# Patient Record
Sex: Female | Born: 2000
Health system: Southern US, Community
[De-identification: ages and names within clinical notes are randomized; demographics above are authoritative.]

## PROBLEM LIST (undated history)

## (undated) DIAGNOSIS — F329 Major depressive disorder, single episode, unspecified: Secondary | ICD-10-CM

## (undated) DIAGNOSIS — D649 Anemia, unspecified: Secondary | ICD-10-CM

## (undated) DIAGNOSIS — F419 Anxiety disorder, unspecified: Secondary | ICD-10-CM

## (undated) DIAGNOSIS — R45851 Suicidal ideations: Secondary | ICD-10-CM

## (undated) DIAGNOSIS — J45909 Unspecified asthma, uncomplicated: Secondary | ICD-10-CM

## (undated) DIAGNOSIS — F32A Depression, unspecified: Secondary | ICD-10-CM

## (undated) HISTORY — DX: Depression, unspecified: F32.A

## (undated) HISTORY — DX: Suicidal ideations: R45.851

---
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
## 2019-07-25 ENCOUNTER — Inpatient Hospital Stay (HOSPITAL_COMMUNITY)
Admission: AD | Admit: 2019-07-25 | Discharge: 2019-07-27 | DRG: 885 | Disposition: A | Discharge status: Home / Self Care | Payer: 59 | Source: Intra-hospital | Attending: Psychiatry | Admitting: Psychiatry

## 2019-07-25 ENCOUNTER — Encounter (HOSPITAL_COMMUNITY): Payer: Self-pay | Admitting: Psychiatry

## 2019-07-25 ENCOUNTER — Ambulatory Visit (HOSPITAL_COMMUNITY)
Admission: RE | Admit: 2019-07-25 | Discharge: 2019-07-25 | Disposition: A | Discharge status: Home / Self Care | Payer: 59 | Attending: Psychiatry | Admitting: Psychiatry

## 2019-07-25 DIAGNOSIS — R45851 Suicidal ideations: Secondary | ICD-10-CM | POA: Diagnosis present

## 2019-07-25 DIAGNOSIS — F29 Unspecified psychosis not due to a substance or known physiological condition: Secondary | ICD-10-CM | POA: Diagnosis present

## 2019-07-25 DIAGNOSIS — Z818 Family history of other mental and behavioral disorders: Secondary | ICD-10-CM | POA: Diagnosis not present

## 2019-07-25 DIAGNOSIS — G4700 Insomnia, unspecified: Secondary | ICD-10-CM | POA: Diagnosis present

## 2019-07-25 DIAGNOSIS — R45850 Homicidal ideations: Secondary | ICD-10-CM | POA: Diagnosis not present

## 2019-07-25 DIAGNOSIS — Z915 Personal history of self-harm: Secondary | ICD-10-CM | POA: Diagnosis not present

## 2019-07-25 DIAGNOSIS — Z20828 Contact with and (suspected) exposure to other viral communicable diseases: Secondary | ICD-10-CM | POA: Insufficient documentation

## 2019-07-25 DIAGNOSIS — F481 Depersonalization-derealization syndrome: Secondary | ICD-10-CM | POA: Diagnosis present

## 2019-07-25 DIAGNOSIS — J45909 Unspecified asthma, uncomplicated: Secondary | ICD-10-CM | POA: Diagnosis present

## 2019-07-25 DIAGNOSIS — Z62810 Personal history of physical and sexual abuse in childhood: Secondary | ICD-10-CM | POA: Insufficient documentation

## 2019-07-25 DIAGNOSIS — F323 Major depressive disorder, single episode, severe with psychotic features: Secondary | ICD-10-CM

## 2019-07-25 DIAGNOSIS — Z23 Encounter for immunization: Secondary | ICD-10-CM | POA: Diagnosis not present

## 2019-07-25 DIAGNOSIS — Z9119 Patient's noncompliance with other medical treatment and regimen: Secondary | ICD-10-CM

## 2019-07-25 DIAGNOSIS — F333 Major depressive disorder, recurrent, severe with psychotic symptoms: Secondary | ICD-10-CM | POA: Insufficient documentation

## 2019-07-25 HISTORY — DX: Unspecified psychosis not due to a substance or known physiological condition: F29

## 2019-07-25 LAB — SARS CORONAVIRUS 2 BY RT PCR (HOSPITAL ORDER, PERFORMED IN ~~LOC~~ HOSPITAL LAB): SARS Coronavirus 2: NEGATIVE

## 2019-07-25 MED ORDER — MAGNESIUM HYDROXIDE 400 MG/5ML PO SUSP: 30.0000 mL | Freq: Every day | ORAL | Status: DC | PRN

## 2019-07-25 MED ORDER — ACETAMINOPHEN 325 MG PO TABS: 650.0000 mg | ORAL_TABLET | Freq: Four times a day (QID) | ORAL | Status: DC | PRN

## 2019-07-25 MED ORDER — ALBUTEROL SULFATE HFA 108 (90 BASE) MCG/ACT IN AERS: 1.0000 | INHALATION_SPRAY | RESPIRATORY_TRACT | Status: DC | PRN

## 2019-07-25 MED ORDER — ALUM & MAG HYDROXIDE-SIMETH 200-200-20 MG/5ML PO SUSP: 30.0000 mL | ORAL | Status: DC | PRN

## 2019-07-25 MED ORDER — TRAZODONE HCL 50 MG PO TABS
50.0000 mg | ORAL_TABLET | Freq: Every evening | ORAL | Status: DC | PRN
  Administered 2019-07-25: 50 mg via ORAL
  Filled 2019-07-25: qty 1

## 2019-07-25 MED ORDER — HYDROXYZINE HCL 25 MG PO TABS
25.0000 mg | ORAL_TABLET | Freq: Three times a day (TID) | ORAL | Status: DC | PRN
  Administered 2019-07-25: 25 mg via ORAL
  Filled 2019-07-25: qty 1

## 2019-07-25 NOTE — H&P (Signed)
Behavioral Health Medical Screening Exam  Elizabeth Waters is an 18 y.o. female with history of depression with prior inpatient admissions to child/adolescent unit. She is presenting with her mother for new onset of paranoia, behavioral changes, and derealization over the last month. She reports feeling that she is being followed and monitored, particularly in the bathroom and hallways. She reports VH of lights and "black balls." She states "I don't know if I'm alive or not." She reports intermittent SI. She has history of four prior suicide attempts. She reports HI toward a former friend with no plan and is vague in describing this. Per her mother, she has shown behavioral changes with increased isolation to her bedroom and erratic sleep patterns over the last month- "I can just tell something is off." Denies drug/alcohol use.  Total Time spent with patient: 15 minutes  Psychiatric Specialty Exam: Physical Exam  Vitals reviewed. Constitutional: She is oriented to person, place, and time. She appears well-developed and well-nourished.  Cardiovascular: Normal rate.  Respiratory: Effort normal.  Neurological: She is alert and oriented to person, place, and time.    Review of Systems  Constitutional: Negative.   Respiratory: Negative for cough and shortness of breath.   Cardiovascular: Negative for chest pain.  Gastrointestinal: Negative for diarrhea, nausea and vomiting.  Neurological: Negative for tremors, sensory change and headaches.  Psychiatric/Behavioral: Positive for depression, hallucinations and suicidal ideas. Negative for substance abuse. The patient is nervous/anxious and has insomnia.     Blood pressure 125/84, pulse 100, temperature 99 F (37.2 C), temperature source Oral, resp. rate 20, SpO2 100 %.There is no height or weight on file to calculate BMI.  General Appearance: Casual  Eye Contact:  Fair  Speech:  Normal Rate  Volume:  Normal  Mood:  Anxious and Depressed  Affect:   Blunt  Thought Process:  Coherent  Orientation:  Full (Time, Place, and Person)  Thought Content:  Hallucinations: Visual and Paranoid Ideation  Suicidal Thoughts:  Yes.  without intent/plan  Homicidal Thoughts:  Yes.  without intent/plan  Memory:  Immediate;   Fair Recent;   Fair Remote;   Fair  Judgement:  Fair  Insight:  Lacking  Psychomotor Activity:  Normal  Concentration: Concentration: Fair and Attention Span: Fair  Recall:  [employer] of Knowledge:Fair  Language: Good  Akathisia:  No  Handed:  Right  AIMS (if indicated):     Assets:  Communication Skills Housing Physical Health Resilience Social Support  Sleep:       Musculoskeletal: Strength & Muscle Tone: within normal limits Gait & Station: normal Patient leans: N/A  Blood pressure 125/84, pulse 100, temperature 99 F (37.2 C), temperature source Oral, resp. rate 20, SpO2 100 %.  Recommendations:  Inpatient hospitalization. Based on my evaluation the patient does not appear to have an emergency medical condition.  Connye Burkitt, NP 07/25/2019, 3:59 PM
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
## 2019-07-26 ENCOUNTER — Other Ambulatory Visit: Payer: Self-pay

## 2019-07-26 DIAGNOSIS — F333 Major depressive disorder, recurrent, severe with psychotic symptoms: Principal | ICD-10-CM

## 2019-07-26 LAB — URINALYSIS, ROUTINE W REFLEX MICROSCOPIC
Bilirubin Urine: NEGATIVE
Glucose, UA: NEGATIVE mg/dL
Hgb urine dipstick: NEGATIVE
Ketones, ur: NEGATIVE mg/dL
Leukocytes,Ua: NEGATIVE
Nitrite: NEGATIVE
Protein, ur: NEGATIVE mg/dL
Specific Gravity, Urine: 1.026 (ref 1.005–1.030)
pH: 5 (ref 5.0–8.0)

## 2019-07-26 LAB — COMPREHENSIVE METABOLIC PANEL
ALT: 13 U/L (ref 0–44)
AST: 16 U/L (ref 15–41)
Albumin: 3.9 g/dL (ref 3.5–5.0)
Alkaline Phosphatase: 87 U/L (ref 38–126)
Anion gap: 11 (ref 5–15)
BUN: 12 mg/dL (ref 6–20)
CO2: 21 mmol/L — ABNORMAL LOW (ref 22–32)
Calcium: 9.1 mg/dL (ref 8.9–10.3)
Chloride: 108 mmol/L (ref 98–111)
Creatinine, Ser: 0.71 mg/dL (ref 0.44–1.00)
GFR calc Af Amer: >60 mL/min (ref >60)
GFR calc non Af Amer: >60 mL/min (ref >60)
Glucose, Bld: 88 mg/dL (ref 70–99)
Potassium: 3.5 mmol/L (ref 3.5–5.1)
Sodium: 140 mmol/L (ref 135–145)
Total Bilirubin: 0.5 mg/dL (ref 0.3–1.2)
Total Protein: 7.5 g/dL (ref 6.5–8.1)

## 2019-07-26 LAB — LIPID PANEL
Cholesterol: 175 mg/dL (ref 0–200)
HDL: 48 mg/dL (ref >40)
LDL Cholesterol: 115 mg/dL — ABNORMAL HIGH (ref 0–99)
Total CHOL/HDL Ratio: 3.6 RATIO
Triglycerides: 62 mg/dL (ref <150)
VLDL: 12 mg/dL (ref 0–40)

## 2019-07-26 LAB — RAPID URINE DRUG SCREEN, HOSP PERFORMED
Amphetamines: NOT DETECTED
Barbiturates: NOT DETECTED
Benzodiazepines: NOT DETECTED
Cocaine: NOT DETECTED
Opiates: NOT DETECTED
Tetrahydrocannabinol: NOT DETECTED

## 2019-07-26 LAB — CBC
HCT: 37.6 % (ref 36.0–46.0)
Hemoglobin: 10.9 g/dL — ABNORMAL LOW (ref 12.0–15.0)
MCH: 23.7 pg — ABNORMAL LOW (ref 26.0–34.0)
MCHC: 29 g/dL — ABNORMAL LOW (ref 30.0–36.0)
MCV: 81.7 fL (ref 80.0–100.0)
Platelets: 487 10*3/uL — ABNORMAL HIGH (ref 150–400)
RBC: 4.6 MIL/uL (ref 3.87–5.11)
RDW: 17.6 % — ABNORMAL HIGH (ref 11.5–15.5)
WBC: 9 10*3/uL (ref 4.0–10.5)
nRBC: 0 % (ref 0.0–0.2)

## 2019-07-26 LAB — HEMOGLOBIN A1C
Hgb A1c MFr Bld: 5.4 % (ref 4.8–5.6)
Mean Plasma Glucose: 108.28 mg/dL

## 2019-07-26 LAB — TSH: TSH: 2.208 u[IU]/mL (ref 0.350–4.500)

## 2019-07-26 LAB — PREGNANCY, URINE: Preg Test, Ur: NEGATIVE

## 2019-07-26 MED ORDER — PRENATAL MULTIVITAMIN CH
1.0000 | ORAL_TABLET | Freq: Every day | ORAL | Status: DC
  Administered 2019-07-26 – 2019-07-27 (×2): 1 via ORAL
  Filled 2019-07-26 (×3): qty 1

## 2019-07-26 MED ORDER — INFLUENZA VAC SPLIT QUAD 0.5 ML IM SUSY
0.5000 mL | PREFILLED_SYRINGE | INTRAMUSCULAR | Status: AC
  Administered 2019-07-27: 0.5 mL via INTRAMUSCULAR
  Filled 2019-07-26: qty 0.5

## 2019-07-26 MED ORDER — ZOLPIDEM TARTRATE 5 MG PO TABS
10.0000 mg | ORAL_TABLET | Freq: Every day | ORAL | Status: DC
  Administered 2019-07-26: 10 mg via ORAL
  Filled 2019-07-26: qty 2

## 2019-07-26 MED ORDER — MODAFINIL 100 MG PO TABS
100.0000 mg | ORAL_TABLET | Freq: Every day | ORAL | Status: DC
  Administered 2019-07-26 – 2019-07-27 (×2): 100 mg via ORAL
  Filled 2019-07-26 (×2): qty 1

## 2019-07-26 MED ORDER — VORTIOXETINE HBR 10 MG PO TABS
10.0000 mg | ORAL_TABLET | Freq: Every day | ORAL | Status: DC
  Administered 2019-07-26 – 2019-07-27 (×2): 10 mg via ORAL
  Filled 2019-07-26 (×3): qty 1

## 2019-07-26 NOTE — [facility] Suicide Risk Assessment (Signed)
[facility] Admission Suicide Risk Assessment   Nursing information obtained from:  Patient Demographic factors:  Unemployed, Adolescent or young adult Current Mental Status:  NA Loss Factors:  NA Historical Factors:  Prior suicide attempts Risk Reduction Factors:  Positive therapeutic relationship, Living with another person, especially a relative, Positive coping skills or problem solving skills  Total Time spent with patient: 45 minutes Principal Problem: <principal problem not specified> Diagnosis:  Active Problems:   Psychosis ([place])  Subjective Data: Admitted for stabilization  Continued Clinical Symptoms:  Alcohol Use Disorder Identification Test Final Score (AUDIT): 0 The "Alcohol Use Disorders Identification Test", Guidelines for Use in Primary Care, Second Edition.  World Pharmacologist [facility]). Score between 0-7:  no or low risk or alcohol related problems. Score between 8-15:  moderate risk of alcohol related problems. Score between 16-19:  high risk of alcohol related problems. Score 20 or above:  warrants further diagnostic evaluation for alcohol dependence and treatment.   CLINICAL FACTORS:   Depression:   Insomnia  Musculoskeletal: Strength & Muscle Tone: within normal limits Gait & Station: normal Patient leans: N/A  Psychiatric Specialty Exam: Physical Exam  Nursing note and vitals reviewed. Constitutional: She appears well-developed and well-nourished.  Cardiovascular: Normal rate and regular rhythm.    Review of Systems  Constitutional: Negative.   Eyes: Negative.   Cardiovascular: Negative.   Gastrointestinal: Negative.   Skin: Negative.   Neurological: Negative.   Endo/Heme/Allergies: Negative.     Blood pressure 140/82, pulse (!) 105, temperature 98.8 F (37.1 C), temperature source Oral, resp. rate 16, height 5\' 3"  (1.6 m), weight 100 kg, SpO2 98 %.Body mass index is 39.05 kg/m.  General Appearance: Casual  Eye Contact:  Good  Speech:  Normal  Rate  Volume:  Normal  Mood:  Dysphoric  Affect:  Flat  Thought Process:  Coherent, Goal Directed and Descriptions of Associations: Circumstantial  Orientation:  Full (Time, Place, and Person)  Thought Content:  Tangential  Suicidal Thoughts:  No  Homicidal Thoughts:  No  Memory:  Immediate;   Fair Recent;   Fair Remote;   Fair  Judgement:  Fair  Insight:  Fair  Psychomotor Activity:  Normal  Concentration:  Concentration: Fair and Attention Span: Fair  Recall:  [employer] of Knowledge:  Fair  Language:  Fair  Akathisia:  Negative  Handed:  Right  AIMS (if indicated):     Assets:  Communication Skills Desire for Improvement  ADL's:  Intact  Cognition:  WNL  Sleep:         COGNITIVE FEATURES THAT CONTRIBUTE TO RISK:  Polarized thinking    SUICIDE RISK:   Mild:  Suicidal ideation of limited frequency, intensity, duration, and specificity.  There are no identifiable plans, no associated intent, mild dysphoria and related symptoms, good self-control (both objective and subjective assessment), few other risk factors, and identifiable protective factors, including available and accessible social support.  PLAN OF CARE: Admit for stabilization  I certify that inpatient services furnished can reasonably be expected to improve the patient's condition.   Johnn Hai, MD 07/26/2019, 9:21 AM

## 2019-07-26 NOTE — [facility] Suicide Risk Assessment (Signed)
[place] INPATIENT:  Family/Significant Other Suicide Prevention Education  Suicide Prevention Education:  Patient Refusal for Family/Significant Other Suicide Prevention Education: The patient Elizabeth Waters has refused to provide written consent for family/significant other to be provided Family/Significant Other Suicide Prevention Education during admission and/or prior to discharge.  Physician notified.  SPE completed with pt, as pt refused to consent to family contact. SPI pamphlet provided to pt and pt was encouraged to share information with support network, ask questions, and talk about any concerns relating to SPE. Pt denies access to guns/firearms and verbalized understanding of information provided. Mobile Crisis information also provided to pt.   Rozann Lesches 07/26/2019, 11:29 AM

## 2019-07-26 NOTE — Progress Notes (Signed)
Recreation Therapy Notes  INPATIENT RECREATION THERAPY ASSESSMENT  Patient Details Name: Elizabeth Waters MRN: [id number] DOB: September 16, 2000 Today's Date: 07/26/2019       Information Obtained From: Patient  Able to Participate in Assessment/Interview: Yes  Patient Presentation: Alert  Reason for Admission (Per Patient): Other (Comments)(Depression; Confused)  Patient Stressors: Family, Other (Comment)(Living situation)  Coping Skills:   Isolation, TV, Music, Talk, Art, Avoidance, Hot Bath/Shower  Leisure Interests (2+):  Individual - Other (Comment), Music - Singing, Art - Draw, Crafts - Other (Comment)(Sleep; Make jewelry)  Frequency of Recreation/Participation: Other (Comment)(Daily)  Awareness of Community Resources:  Yes  Community Resources:  Restaurants  Current Use: Yes  If no, Barriers?:    Expressed Interest in [employer] Information: No  County of Residence:  [place]  Patient Main Form of Transportation: Car  Patient Strengths:  Caring; Willing to hear both sides of an argument  Patient Identified Areas of Improvement:  Memory; Concentration  Patient Goal for Hospitalization:  "try to figure out what's going on and concentration"  Current SI (including self-harm):  No  Current HI:  No  Current AVH: No  Staff Intervention Plan: Group Attendance, Collaborate with Interdisciplinary Treatment Team  Consent to Intern Participation: N/A    Victorino Sparrow, LRT/CTRS  Victorino Sparrow A 07/26/2019, 11:48 AM

## 2019-07-26 NOTE — Tx Team (Signed)
Initial Treatment Plan 07/26/2019 3:21 AM Elizabeth Waters Elizabeth Waters HAL:[id number]    PATIENT STRESSORS: Other: Disturbed thought process   PATIENT STRENGTHS: Ability for insight Communication skills General fund of knowledge Physical Health   PATIENT IDENTIFIED PROBLEMS: Depression  Anxiety  hallucinations  paranoia               DISCHARGE CRITERIA:  Improved stabilization in mood, thinking, and/or behavior Medical problems require only outpatient monitoring Motivation to continue treatment in a less acute level of care Verbal commitment to aftercare and medication compliance  PRELIMINARY DISCHARGE PLAN: Outpatient therapy Participate in family therapy Return to previous living arrangement  PATIENT/FAMILY INVOLVEMENT: This treatment plan has been presented to and reviewed with the patient, Elizabeth Waters.  The patient has been given the opportunity to ask questions and make suggestions.  Ronelle Nigh, RN 07/26/2019, 3:21 AM

## 2019-07-26 NOTE — Progress Notes (Signed)
Recreation Therapy Notes  Date: 12.1.20 Time: 1000 Location: 500 Hall Dayroom  Group Topic: Coping Skills  Goal Area(s) Addresses:  Patient will identify positive coping skills. Patient will identify benefit of using coping skills post d/c.  Behavioral Response: Engaged  Intervention: Worksheet, pencils  Activity: Mind Map.  LRT and patients filled in the first 8 boxes of the mind map with being honest, anxiety, loss of job, having kids, school, work, depression and losing someone.  Patients were then given time to come up with at least 3 coping skills for each situation identified.   Education: Radiographer, therapeutic, Dentist.   Education Outcome: Acknowledges understanding/In group clarification offered/Needs additional education.   Clinical Observations/Feedback: Pt identified coping skills as coloring, music, walking and singing.  Pt was attentive and engaged throughout activity.     Victorino Sparrow, LRT/CTRS     Victorino Sparrow A 07/26/2019 11:24 AM

## 2019-07-26 NOTE — Progress Notes (Signed)
   07/26/19 2300  Psych Admission Type (Psych Patients Only)  Admission Status Voluntary  Psychosocial Assessment  Patient Complaints Depression  Eye Contact Fair  Facial Expression Flat;Pensive  Affect Depressed;Sad  Speech Logical/coherent  Interaction Cautious;Submissive  Motor Activity Other (Comment) (WNL)  Appearance/Hygiene Unremarkable  Behavior Characteristics Cooperative  Mood Depressed;Sad  Thought Process  Coherency WDL  Content WDL  Delusions WDL  Perception WDL  Hallucination Visual  Judgment Poor  Confusion WDL  Danger to Self  Current suicidal ideation? Denies  Danger to Others  Danger to Others None reported or observed   Pt presented sad, but brightened on approach and was very pleasant when talking. Pt stated she felt a little better

## 2019-07-26 NOTE — Progress Notes (Signed)
Patient denies SI, HI and AVH this shift.  Patient has been compliant with medications and attended groups this shift.  Patient has had no incidents of behavioral dyscontrol.   Assess patient for safety, offer medications as prescribed, engage patient in 1:1 staff talks.   Patient able to contract for safety.  Continue to monitor as prescribed.  
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
## 2019-07-26 NOTE — H&P (Signed)
Psychiatric Admission Assessment Adult  Patient Identification: Elizabeth Waters MRN:  [id number] Date of Evaluation:  07/26/2019 Chief Complaint: Depression with psychosis and complaints of derealization  principal Diagnosis:  Diagnosis:  Active Problems:   Psychosis (HCC)  History of Present Illness:   This is the third lifetime psychiatric admission but the first as an adult for Elizabeth Waters, she is 18 years of age and presented voluntarily on 11/30 with the chief complaint of "Derealization" She also reports chronic depression, her last admission to the adolescent unit was at age 21, the year 2018, when she complained of suicidal thoughts and plans.  She in the past had been prescribed fluoxetine but has not had it for the past 2 months-  She specifically describes "derealization as" watching videos or some other activity and then losing track of what she is doing and becoming a little disoriented but she is never unaware of person place day so forth just situation-further she reports seeing things "in the corner sometimes" but this is a rather vague complaint.  She also reported intermittent suicidal thoughts with 4 prior attempts to the nurse practitioner yesterday but denies current suicidal thoughts plans or intent. Denies substance abuse  Associated Signs/Symptoms: Depression Symptoms:  insomnia, reports days and nights are reversed (Hypo) Manic Symptoms:  Distractibility, Anxiety Symptoms:  Excessive Worry, Psychotic Symptoms:  Hallucinations: Visual PTSD Symptoms: NA Total Time spent with patient: 45 minutes  Past Psychiatric History: As discussed prior admissions as a teen  Is the patient at risk to self? Yes.    Has the patient been a risk to self in the past 6 months? No.  Has the patient been a risk to self within the distant past? Yes.    Is the patient a risk to others? No.  Has the patient been a risk to others in the past 6 months? No.  Has the patient been a risk to others  within the distant past? No.   Prior Inpatient Therapy:  2 prior admissions as discussed most recently on fluoxetine Prior Outpatient Therapy:  Noncompliant with fluoxetine about 2 months  Alcohol Screening: 1. How often do you have a drink containing alcohol?: Never 2. How many drinks containing alcohol do you have on a typical day when you are drinking?: 1 or 2 3. How often do you have six or more drinks on one occasion?: Never AUDIT-C Score: 0 4. How often during the last year have you found that you were not able to stop drinking once you had started?: Never 5. How often during the last year have you failed to do what was normally expected from you becasue of drinking?: Never 6. How often during the last year have you needed a first drink in the morning to get yourself going after a heavy drinking session?: Never 7. How often during the last year have you had a feeling of guilt of remorse after drinking?: Never 8. How often during the last year have you been unable to remember what happened the night before because you had been drinking?: Never 9. Have you or someone else been injured as a result of your drinking?: No 10. Has a relative or friend or a doctor or another health worker been concerned about your drinking or suggested you cut down?: No Alcohol Use Disorder Identification Test Final Score (AUDIT): 0 Alcohol Brief Interventions/Follow-up: Continued Monitoring Substance Abuse History in the last 12 months:  No. Consequences of Substance Abuse: NA Previous Psychotropic Medications: Yes  Psychological Evaluations:  No  Past Medical History:  Past Medical History:  Diagnosis Date  . Anemia   . Anxiety   . Asthma   . Depression    History reviewed. No pertinent surgical history. Family History:  Family History  Problem Relation Age of Onset  . Hypertension Mother   . Diabetes Father   . Asthma Father   . Hypertension Father   . Depression Father    Family Psychiatric   History: Patient denies Tobacco Screening: Have you used any form of tobacco in the last 30 days? (Cigarettes, Smokeless Tobacco, Cigars, and/or Pipes): No Social History:  Social History   Substance and Sexual Activity  Alcohol Use No     Social History   Substance and Sexual Activity  Drug Use No    Additional Social History:                           Allergies:   Allergies  Allergen Reactions  . Almond Oil Shortness Of Breath  . Grapeseed Extract [Nutritional Supplements] Shortness Of Breath    Grapes  . Other Shortness Of Breath    Pineapple, almonds 06/24/17: Patient states she is also allergic to [facility] fruit  . Pineapple Shortness Of Breath   Lab Results:  Results for orders placed or performed during the hospital encounter of 07/25/19 (from the past 48 hour(s))  Pregnancy, urine     Status: None   Collection Time: 07/26/19  6:27 AM  Result Value Ref Range   Preg Test, Ur NEGATIVE NEGATIVE    Comment:        THE SENSITIVITY OF THIS METHODOLOGY IS >20 mIU/mL. Performed at [facility], [address] [place], [place] [address]   Urinalysis, Routine w reflex microscopic     Status: None   Collection Time: 07/26/19  6:27 AM  Result Value Ref Range   Color, Urine YELLOW YELLOW   APPearance CLEAR CLEAR   Specific Gravity, Urine 1.026 1.005 - 1.030   pH 5.0 5.0 - 8.0   Glucose, UA NEGATIVE NEGATIVE mg/dL   Hgb urine dipstick NEGATIVE NEGATIVE   Bilirubin Urine NEGATIVE NEGATIVE   Ketones, ur NEGATIVE NEGATIVE mg/dL   Protein, ur NEGATIVE NEGATIVE mg/dL   Nitrite NEGATIVE NEGATIVE   Leukocytes,Ua NEGATIVE NEGATIVE    Comment: Performed at [facility], [address] [place], [place] [address]  CBC     Status: Abnormal   Collection Time: 07/26/19  6:35 AM  Result Value Ref Range   WBC 9.0 4.0 - 10.5 K/uL   RBC 4.60 3.87 - 5.11 MIL/uL   Hemoglobin 10.9 (L) 12.0 - 15.0 g/dL   HCT 13.037.6 86.536.0 - 78.446.0 %   MCV 81.7  80.0 - 100.0 fL   MCH 23.7 (L) 26.0 - 34.0 pg   MCHC 29.0 (L) 30.0 - 36.0 g/dL   RDW 69.617.6 (H) 29.511.5 - 28.415.5 %   Platelets 487 (H) 150 - 400 K/uL   nRBC 0.0 0.0 - 0.2 %    Comment: Performed at [facility], [address] [place], [place] [address]    Blood Alcohol level:  Lab Results  Component Value Date   [facility] <10 11/03/2017   ETH <10 06/24/2017    Metabolic Disorder Labs:  Lab Results  Component Value Date   HGBA1C 5.3 06/26/2017   MPG 105.41 06/26/2017   MPG 103 12/07/2016   Lab Results  Component Value Date  PROLACTIN 43.0 (H) 06/26/2017   Lab Results  Component Value Date   CHOL 156 06/26/2017   TRIG 57 06/26/2017   HDL 49 06/26/2017   CHOLHDL 3.2 06/26/2017   VLDL 11 06/26/2017   LDLCALC 96 06/26/2017   LDLCALC 85 12/07/2016    Current Medications: Current Facility-Administered Medications  Medication Dose Route Frequency Provider Last Rate Last Dose  . acetaminophen (TYLENOL) tablet 650 mg  650 mg Oral Q6H PRN Aldean Baker, NP      . albuterol (VENTOLIN HFA) 108 (90 Base) MCG/ACT inhaler 1-2 puff  1-2 puff Inhalation Q4H PRN Aldean Baker, NP      . alum & mag hydroxide-simeth (MAALOX/MYLANTA) 200-200-20 MG/5ML suspension 30 mL  30 mL Oral Q4H PRN Aldean Baker, NP      . hydrOXYzine (ATARAX/VISTARIL) tablet 25 mg  25 mg Oral TID PRN Aldean Baker, NP   25 mg at 07/25/19 2355  . magnesium hydroxide (MILK OF MAGNESIA) suspension 30 mL  30 mL Oral Daily PRN Aldean Baker, NP      . modafinil (PROVIGIL) tablet 100 mg  100 mg Oral Daily Malvin Johns, MD      . prenatal multivitamin tablet 1 tablet  1 tablet Oral Q1200 Malvin Johns, MD      . traZODone (DESYREL) tablet 50 mg  50 mg Oral QHS PRN Aldean Baker, NP   50 mg at 07/25/19 2354  . vortioxetine HBr (TRINTELLIX) tablet 10 mg  10 mg Oral Daily Malvin Johns, MD      . zolpidem Remus Loffler) tablet 10 mg  10 mg Oral QHS Malvin Johns, MD       PTA Medications: Medications Prior to  Admission  Medication Sig Dispense Refill Last Dose  . albuterol (PROVENTIL HFA;VENTOLIN HFA) 108 (90 Base) MCG/ACT inhaler Inhale 2 puffs into the lungs every 4 (four) hours as needed for wheezing or shortness of breath. 2 Inhaler 0   . FLUoxetine (PROZAC) 40 MG capsule TAKE 1 CAPSULE BY MOUTH DAILY. 30 capsule 2   . hydrOXYzine (ATARAX/VISTARIL) 25 MG tablet Take 1 tablet (25 mg total) at bedtime as needed by mouth (insomnia). (Patient not taking: Reported on 11/03/2017) 30 tablet 0     Musculoskeletal: Strength & Muscle Tone: within normal limits Gait & Station: normal Patient leans: N/A  Psychiatric Specialty Exam: Physical Exam  Nursing note and vitals reviewed. Constitutional: She appears well-developed and well-nourished.  Cardiovascular: Normal rate and regular rhythm.    Review of Systems  Constitutional: Negative.   Eyes: Negative.   Cardiovascular: Negative.   Gastrointestinal: Negative.   Skin: Negative.   Neurological: Negative.   Endo/Heme/Allergies: Negative.     Blood pressure 140/82, pulse (!) 105, temperature 98.8 F (37.1 C), temperature source Oral, resp. rate 16, height 5\' 3"  (1.6 m), weight 100 kg, SpO2 98 %.Body mass index is 39.05 kg/m.  General Appearance: Casual  Eye Contact:  Good  Speech:  Normal Rate  Volume:  Normal  Mood:  Dysphoric  Affect:  Flat  Thought Process:  Coherent, Goal Directed and Descriptions of Associations: Circumstantial  Orientation:  Full (Time, Place, and Person)  Thought Content:  Tangential  Suicidal Thoughts:  No  Homicidal Thoughts:  No  Memory:  Immediate;   Fair Recent;   Fair Remote;   Fair  Judgement:  Fair  Insight:  Fair  Psychomotor Activity:  Normal  Concentration:  Concentration: Fair and Attention Span: Fair  Recall:   of Knowledge:  Fair  Language:  Fair  Akathisia:  Negative  Handed:  Right  AIMS (if indicated):     Assets:  Communication Skills Desire for Improvement  ADL's:  Intact   Cognition:  WNL  Sleep:       Treatment Plan Summary: Daily contact with patient to assess and evaluate symptoms and progress in treatment and Medication management  Observation Level/Precautions:  15 minute checks  Laboratory:  UDS  Psychotherapy: Reality based  Medications: Begin fluoxetine and augmentation strategies  Consultations: None necessary  Discharge Concerns: Longer-term stability  Estimated LOS: 7-10  Other:     Physician Treatment Plan for Primary Diagnosis:  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/Axis II consider Axis II pathology recommendations again begin antidepressant and augmentation strategies  Long Term Goal(s): Improvement in symptoms so as ready for discharge  Short Term Goals: Ability to identify changes in lifestyle to reduce recurrence of condition will improve, Ability to verbalize feelings will improve, Ability to disclose and discuss suicidal ideas, Ability to demonstrate self-control will improve and Ability to identify and develop effective coping behaviors will improve  Physician Treatment Plan for Secondary Diagnosis: Active Problems:   Psychosis ([place])  Long Term Goal(s): Improvement in symptoms so as ready for discharge  Short Term Goals: Ability to demonstrate self-control will improve, Ability to identify and develop effective coping behaviors will improve, Ability to maintain clinical measurements within normal limits will improve and Compliance with prescribed medications will improve  I certify that inpatient services furnished can reasonably be expected to improve the patient's condition.    Johnn Hai, MD 12/1/20208:10 AM

## 2019-07-26 NOTE — Plan of Care (Signed)
Newly admitted and currently in bed sleeping. No sign of distress.
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
## 2019-07-27 DIAGNOSIS — F323 Major depressive disorder, single episode, severe with psychotic features: Secondary | ICD-10-CM

## 2019-07-27 HISTORY — DX: Major depressive disorder, single episode, severe with psychotic features: F32.3

## 2019-07-27 LAB — PROLACTIN: Prolactin: 78.5 ng/mL — ABNORMAL HIGH (ref 4.8–23.3)

## 2019-07-27 MED ORDER — HYDROXYZINE HCL 25 MG PO TABS: 25.0000 mg | ORAL_TABLET | Freq: Three times a day (TID) | ORAL | 0 refills | Status: DC | PRN

## 2019-07-27 MED ORDER — MODAFINIL 100 MG PO TABS: 100.0000 mg | ORAL_TABLET | Freq: Every day | ORAL | 0 refills | Status: DC

## 2019-07-27 MED ORDER — PRENATAL MULTIVITAMIN CH: 1.0000 | ORAL_TABLET | Freq: Every day | ORAL | 0 refills | Status: DC

## 2019-07-27 MED ORDER — ZOLPIDEM TARTRATE 10 MG PO TABS: 10.0000 mg | ORAL_TABLET | Freq: Every day | ORAL | 0 refills | Status: DC

## 2019-07-27 MED ORDER — ALBUTEROL SULFATE HFA 108 (90 BASE) MCG/ACT IN AERS: 1.0000 | INHALATION_SPRAY | RESPIRATORY_TRACT | Status: AC | PRN

## 2019-07-27 MED ORDER — VORTIOXETINE HBR 10 MG PO TABS: 10.0000 mg | ORAL_TABLET | Freq: Every day | ORAL | 0 refills | Status: DC

## 2019-07-27 MED ORDER — TRAZODONE HCL 50 MG PO TABS: 50.0000 mg | ORAL_TABLET | Freq: Every evening | ORAL | 0 refills | Status: DC | PRN

## 2019-07-27 NOTE — Progress Notes (Signed)
Patient ID: Elizabeth Waters, female   DOB: 09/01/2000, 18 y.o.   MRN: [id number] Patient discharged to home/self care in the presence of her mother.  Patient denies SI, HI and AVH upon discharge.  Patient acknowledged understanding of all discharge instructions and receipt of all personal belongings. Patient bright and eager to discharge.

## 2019-07-27 NOTE — Progress Notes (Signed)
Recreation Therapy Notes  INPATIENT RECREATION TR PLAN  Patient Details Name: Elizabeth Waters MRN: [id number] DOB: [birth year] Today's Date: 07/27/2019  Rec Therapy Plan Is patient appropriate for Therapeutic Recreation?: Yes Treatment times per week: abou 3 days Estimated Length of Stay: 5-7 days TR Treatment/Interventions: Group participation (Comment)  Discharge Criteria Pt will be discharged from therapy if:: Discharged Treatment plan/goals/alternatives discussed and agreed upon by:: Patient/family  Discharge Summary Short term goals set: See patient care plan Short term goals met: Complete Progress toward goals comments: Groups attended Which groups?: Coping skills, Communication Reason goals not met: None Therapeutic equipment acquired: N/A Reason patient discharged from therapy: Refusal of 3 consecutive treatment sessions without medical reason Pt/family agrees with progress & goals achieved: Yes Date patient discharged from therapy: 07/27/19     Victorino Sparrow, LRT/CTRS  [name], [name] 07/27/2019, 11:18 AM

## 2019-07-27 NOTE — Progress Notes (Signed)
Virtual Visit via Telephone Note  I connected with Elizabeth Waters's Mother on 07/27/2019 at 10:05 am  by telephone and verified that I am speaking with the correct person using two identifiers.  Location:   I discussed the limitations, risks, security and privacy concerns of performing an evaluation and management service by telephone and the availability of in person appointments. I also discussed with the patient that there may be a patient responsible charge related to this service. The patient expressed understanding and agreed to proceed.   History of Present Illness: Mom was updated on Elizabeth Waters's current progress. She was told Elizabeth Waters has not had any further episodes of de-realization. Mom will come pick her up at 1 pm today. She will call us before driving here to make sure all paperwork is ready. She has no further questions.   Assessment and Plan: - Follow recommended discharge medication regimen.   Follow Up Instructions:   I discussed the assessment and treatment plan with the patient. The patient was provided an opportunity to ask questions and all were answered. The patient agreed with the plan and demonstrated an understanding of the instructions.   The patient was advised to call back or seek an in-person evaluation if the symptoms worsen or if the condition fails to improve as anticipated.  I provided 10 minutes of non-face-to-face time during this encounter.   Trinna Post, Medical Student

## 2019-07-27 NOTE — Progress Notes (Signed)
Recreation Therapy Notes  Date: 12.2.20 Time: 1000 Location: 500 Hall Dayroom  Group Topic: Communication  Goal Area(s) Addresses:  Patient will effectively communicate with peers in group.  Patient will verbalize benefit of healthy communication. Patient will verbalize positive effect of healthy communication on post d/c goals.  Patient will identify communication techniques that made activity effective for group.   Behavioral Response: Engaged  Intervention: Drawings, Pencils, Blank Paper   Activity: Geometrical Drawings.  One person comes up a describes a geometrical drawing to the rest of the group.  The presenter will get as detailed as needed to describe the picture.  The remaining members of the group can only ask the presenter to repeat themselves.  They can not ask any specific questions.  When the presenter is finished, the group will compare their pictures to the original.  Education: Communication, Discharge Planning  Education Outcome: Acknowledges understanding/In group clarification offered/Needs additional education.   Clinical Observations/Feedback:  Pt expressed she tried to be as detailed as possible when describing the pictures.  Pt was pleasant and patient with peers while giving instructions.  Pt stated communication can be affected by whether you are clear or loud enough for the other person to understand.  Pt explained body language and eye contact can affect communication as well because there are some cultures where eye contact is considered disrespectful.    Victorino Sparrow, LRT/CTRS    Victorino Sparrow A 07/27/2019 11:12 AM

## 2019-07-27 NOTE — Discharge Summary (Signed)
Physician Discharge Summary Note  Patient:  Elizabeth Waters is an 18 y.o., female  MRN:  [id number]  DOB:  [birth year]  Patient phone:  [phone] (home)   Patient address:   [address] [place] [address],   Total Time spent with patient: Greater than 30 minutes  Date of Admission:  07/25/2019  Date of Discharge: 07-27-19  Reason for Admission: Worsening depression & feeling of derealization.  Principal Problem: Severe recurrent major depressive disorder with psychotic features [facility])  Discharge Diagnoses: Principal Problem:   Severe recurrent major depressive disorder with psychotic features (HCC) Active Problems:   Major depressive disorder, single episode, severe with psychotic features (HCC)   Psychosis (HCC)  Past Psychiatric History: Severe major depressive disorder with psychotic features.  Past Medical History:  Past Medical History:  Diagnosis Date  . Anemia   . Anxiety   . Asthma   . Depression    History reviewed. No pertinent surgical history.  Family History:  Family History  Problem Relation Age of Onset  . Hypertension Mother   . Diabetes Father   . Asthma Father   . Hypertension Father   . Depression Father    Family Psychiatric  History: See H&P  Social History:  Social History   Substance and Sexual Activity  Alcohol Use No     Social History   Substance and Sexual Activity  Drug Use No    Social History   Socioeconomic History  . Marital status: Single    Spouse name: Not on file  . Number of children: Not on file  . Years of education: Not on file  . Highest education level: Not on file  Occupational History  . Not on file  Social Needs  . Financial resource strain: Not on file  . Food insecurity    Worry: Not on file    Inability: Not on file  . Transportation needs    Medical: Not on file    Non-medical: Not on file  Tobacco Use  . Smoking status: Never Smoker  . Smokeless tobacco: Never Used  Substance and  Sexual Activity  . Alcohol use: No  . Drug use: No  . Sexual activity: Yes    Birth control/protection: Condom  Lifestyle  . Physical activity    Days per week: Not on file    Minutes per session: Not on file  . Stress: Not on file  Relationships  . Social Musician on phone: Not on file    Gets together: Not on file    Attends religious service: Not on file    Active member of club or organization: Not on file    Attends meetings of clubs or organizations: Not on file    Relationship status: Not on file  Other Topics Concern  . Not on file  Social History Narrative   Pt lives with mother, older brother and younger brother. No pets in the home.    Hospital Course: (Per Md's admission assessment): This is the third lifetime psychiatric admission but the first as an adult for Elizabeth Waters, she is 18 years of age and presented voluntarily on 11/30 with the chief complaint of "Derealization". She also reports chronic depression, her last admission to the adolescent unit was at age 63, the year 2018, when she complained of suicidal thoughts and plans.  She in the past had been prescribed fluoxetine but has not had it for the past 2 months- She specifically describes "derealization as"  watching videos or some other activity and then losing track of what she is doing and becoming a little disoriented but she is never unaware of person place day so forth just situation-further she reports seeing things "in the corner sometimes" but this is a rather vague complaint.  She also reported intermittent suicidal thoughts with 4 prior attempts to the nurse practitioner yesterday but denies current suicidal thoughts plans or intent.  This is one of several psychiatric discharge summaries for this 18 year old AA female with hx of chronic depression & [facility]. She is [facility] & other psychiatric hospitals for worsening symptoms of depression, suicidal ideations with  plans. She has been tried on multiple psychotropic medications for her symptoms & it appears nothing has actually been helpful in stabilizing her symptoms. [facility] for evaluation & treatment for complaint of derealization.  After evaluation of her presenting symptoms, Elizabeth Waters was recommended for mood stabilization treatments. The medication regimen for her presenting symptoms were discussed & with her consent initiated. She received, stabilized & was discharged on the medications as listed below on her discharge medication lists. She was also enrolled & participated in the group counseling sessions being offered & held on this unit. She learned coping skills. She presented on this admission, other chronic medical conditions that required treatment & monitoring. She was resumed/discharged on all her pertinent home medications for those health issues. She tolerated her treatment regimen without any adverse effects or reactions reported.  During the course of her hospitalization, the 15-minute checks were adequate to ensure Elizabeth Waters's safety.  Patient did not display any dangerous, violent or suicidal behavior on the unit.  She interacted with patients & staff appropriately, participated appropriately in the group sessions/therapies. Her medications were addressed & adjusted to meet her needs. She was recommended for outpatient follow-up care & medication management upon discharge to assure her continuity of care.  At the time of discharge patient is not reporting any acute suicidal/homicidal ideations. She feels more confident about her self-care & in managing the suicidal thoughts. She currently denies any new issues or concerns. Education and supportive counseling provided throughout her hospital stay & upon discharge.  Today upon her discharge evaluation with the attending psychiatrist, [name] shares she is doing well. She denies any other specific concerns. She is sleeping well. Her  appetite is good. She denies other physical complaints. She denies AH/VH. She feels that her medications have been helpful & is in agreement to continue her current treatment regimen as recommended. She was able to engage in safety planning including [facility] or contact emergency services if she feels unable to maintain her own safety or the safety of others. Pt had no further questions, comments, or concerns. [facility] with all personal belongings in no apparent distress. Transportation per her family (mother).  Physical Findings: AIMS:  , ,  ,  ,    CIWA:    COWS:     Musculoskeletal: Strength & Muscle Tone: within normal limits Gait & Station: normal Patient leans: N/A  Psychiatric Specialty Exam: Physical Exam  Constitutional: She is oriented to person, place, and time. She appears well-developed.  Neck: Normal range of motion.  Cardiovascular: Normal rate.  Respiratory: Effort normal.  Genitourinary:    Genitourinary Comments: Deferred   Musculoskeletal: Normal range of motion.  Neurological: She is alert and oriented to person, place, and time.  Skin: Skin is  warm.    Review of Systems  Constitutional: Negative for chills and fever.  Respiratory: Negative for cough, shortness of breath and wheezing.   Cardiovascular: Negative for chest pain and palpitations.  Gastrointestinal: Negative for abdominal pain, heartburn, nausea and vomiting.  Musculoskeletal: Negative for myalgias.  Skin: Negative.   Neurological: Negative for dizziness and headaches.  Psychiatric/Behavioral: Positive for hallucinations (Hx. psychosis (stable).). Negative for memory loss, substance abuse and suicidal ideas. Depression: Stabilized with medication prior to discharge. The patient has insomnia (Stabilized with medication prior to discharge). The patient is not nervous/anxious (Stable).     Blood pressure 115/66, pulse 80, temperature 98.3 F (36.8 C), temperature source Oral, resp.  rate 16, height  (1.6 m), weight 100 kg, SpO2 98 %.Body mass index is 39.05 kg/m.  See Md's discharge SRA   Have you used any form of tobacco in the last 30 days? (Cigarettes, Smokeless Tobacco, Cigars, and/or Pipes): No  Has this patient used any form of tobacco in the last 30 days? (Cigarettes, Smokeless Tobacco, Cigars, and/or Pipes): N/A  Blood Alcohol level:  Lab Results  Component Value Date   ETH <10 11/03/2017   ETH <10 06/24/2017   Metabolic Disorder Labs:  Lab Results  Component Value Date   HGBA1C 5.4 07/26/2019   MPG 108.28 07/26/2019   MPG 105.41 06/26/2017   Lab Results  Component Value Date   PROLACTIN 78.5 (H) 07/26/2019   PROLACTIN 43.0 (H) 06/26/2017   Lab Results  Component Value Date   CHOL 175 07/26/2019   TRIG 62 07/26/2019   HDL 48 07/26/2019   CHOLHDL 3.6 07/26/2019   VLDL 12 07/26/2019   LDLCALC 115 (H) 07/26/2019   LDLCALC 96 06/26/2017   See Psychiatric Specialty Exam and Suicide Risk Assessment completed by Attending Physician prior to discharge.  Discharge destination:  Home  Is patient on multiple antipsychotic therapies at discharge:  No   Has Patient had three or more failed trials of antipsychotic monotherapy by history:  No  Recommended Plan for Multiple Antipsychotic Therapies: NA  Allergies as of 07/27/2019      Reactions   Almond Oil Shortness Of Breath   Grapeseed Extract [nutritional Supplements] Shortness Of Breath   Grapes   Other Shortness Of Breath   Pineapple, almonds 06/24/17: Patient states she is also allergic to [employer] Shortness Of Breath      Medication List    STOP taking these medications   FLUoxetine 40 MG capsule Commonly known as: PROZAC     TAKE these medications     Indication  albuterol 108 (90 Base) MCG/ACT inhaler Commonly known as: VENTOLIN HFA Inhale 1-2 puffs into the lungs every 4 (four) hours as needed for wheezing or shortness of breath. What changed: how much to  take  Indication: Asthma   hydrOXYzine 25 MG tablet Commonly known as: ATARAX/VISTARIL Take 1 tablet (25 mg total) by mouth 3 (three) times daily as needed for anxiety. What changed:   when to take this  reasons to take this  Indication: Feeling Anxious   modafinil 100 MG tablet Commonly known as: PROVIGIL Take 1 tablet (100 mg total) by mouth daily. For day time alertness Start taking on: July 28, 2019  Indication: Recurring Sleep Episodes During the Day   prenatal multivitamin Tabs tablet Take 1 tablet by mouth daily. For Vitamin supplementation Start taking on: July 28, 2019  Indication: Vitamin Deficiency   traZODone 50 MG tablet Commonly known as: DESYREL  Take 1 tablet (50 mg total) by mouth at bedtime as needed for sleep.  Indication: Trouble Sleeping   vortioxetine HBr 10 MG Tabs tablet Commonly known as: TRINTELLIX Take 1 tablet (10 mg total) by mouth daily. For depression Start taking on: July 28, 2019  Indication: Major Depressive Disorder   zolpidem 10 MG tablet Commonly known as: AMBIEN Take 1 tablet (10 mg total) by mouth at bedtime. For sleep  Indication: Trouble Sleeping      Follow-up Information    [facility] Follow up on 08/03/2019.   Why: Your hospital discharge appointment will be held by phone on 12/09 at 10:30am. The provider will call you. Please have access to your hospital discharge paperwork and current medications.  Contact information: [address] [place] [address] [phone]          Follow-up recommendations: Activity:  As tolerated Diet: As recommended by your primary care doctor. Keep all scheduled follow-up appointments as recommended.  Comments: Prescriptions given at discharge.  Patient agreeable to plan.  Given opportunity to ask questions.  Appears to feel comfortable with discharge denies any current suicidal or homicidal thought. Patient is also instructed prior to discharge to: Take all medications  as prescribed by his/her mental healthcare provider. Report any adverse effects and or reactions from the medicines to his/her outpatient provider promptly. Patient has been instructed & cautioned: To not engage in alcohol and or illegal drug use while on prescription medicines. In the event of worsening symptoms, patient is instructed to call the crisis hotline, 911 and or go to the nearest ED for appropriate evaluation and treatment of symptoms. To follow-up with his/her primary care provider for your other medical issues, concerns and or health care needs.  Signed: Lindell Spar, NP, PMHNP, FNP-BC 07/27/2019, 10:34 AM

## 2019-07-27 NOTE — [facility] Suicide Risk Assessment (Signed)
[facility] Discharge Suicide Risk Assessment   Principal Problem: MDD Discharge Diagnoses: Active Problems:   Psychosis ([place])   Total Time spent with patient: 45 minutes  Musculoskeletal: Strength & Muscle Tone: within normal limits Gait & Station: normal Patient leans: N/A  Psychiatric Specialty Exam: ROS  Blood pressure 115/66, pulse 80, temperature 98.3 F (36.8 C), temperature source Oral, resp. rate 16, height 5\' 3"  (1.6 m), weight 100 kg, SpO2 98 %.Body mass index is 39.05 kg/m.  General Appearance: Casual  Eye Contact::  Good  Speech:  Clear and Coherent409  Volume:  Normal  Mood:  Euthymic  Affect:  Restricted  Thought Process:  Coherent, Linear and Descriptions of Associations: Intact  Orientation:  Full (Time, Place, and Person)  Thought Content:  Logical  Suicidal Thoughts:  No  Homicidal Thoughts:  No  Memory:  Immediate;   Poor Recent;   Fair Remote;   Fair  Judgement:  Intact  Insight:  Fair  Psychomotor Activity:  Normal  Concentration:  Good  Recall:  Good  Fund of Knowledge:Fair  Language: Good  Akathisia:  Negative  Handed:  Right  AIMS (if indicated):     Assets:  Communication Skills Desire for Improvement  Sleep:  Number of Hours: 10.25  Cognition: WNL  ADL's:  Intact   Mental Status Per Nursing Assessment::   On Admission:  NA  Demographic Factors:  NA  Loss Factors: NA  Historical Factors: NA  Risk Reduction Factors:   Positive therapeutic relationship and Positive coping skills or problem solving skills  Continued Clinical Symptoms:  Previous Psychiatric Diagnoses and Treatments  Cognitive Features That Contribute To Risk:  None    Suicide Risk:  Minimal: No identifiable suicidal ideation.  Patients presenting with no risk factors but with morbid ruminations; may be classified as minimal risk based on the severity of the depressive symptoms  Follow-up Information    [facility] Follow up on 08/03/2019.   Why: Your hospital  discharge appointment will be held by phone on 12/09 at 10:30am. The provider will call you. Please have access to your hospital discharge paperwork and current medications.  Contact information: [address] [place] [address] [phone]           Plan Of Care/Follow-up recommendations:  Activity:  full  Kimbree Casanas, MD 07/27/2019, 10:14 AM

## 2019-07-27 NOTE — Progress Notes (Signed)
  [facility] Adult Case Management Discharge Plan :  Will you be returning to the same living situation after discharge:  Yes,  home. At discharge, do you have transportation home?: Yes,  mother picking up at 1:00pm. Do you have the ability to pay for your medications: Yes,  [employer].  Release of information consent forms completed and in the chart.  Patient to Follow up at: Follow-up Information    [facility] Follow up on 08/03/2019.   Why: Your hospital discharge appointment will be held by phone on 12/09 at 10:30am. The provider will call you. Please have access to your hospital discharge paperwork and current medications.  Contact information: [place] [address] [phone]           Next level of care provider has access to [place] and Suicide Prevention discussed: Yes,  with patient. Patient declined consents.  Have you used any form of tobacco in the last 30 days? (Cigarettes, Smokeless Tobacco, Cigars, and/or Pipes): No  Has patient been referred to the Quitline?: N/A patient is not a smoker  Patient has been referred for addiction treatment: Yes  Joellen Jersey, [place] 07/27/2019, 9:50 AM

## 2019-07-27 NOTE — Plan of Care (Signed)
Pt was able to focus on task without prompting from LRT at completion of recreation therapy group sessions.   Victorino Sparrow, LRT/CTRS

## 2019-07-27 NOTE — Tx Team (Signed)
Interdisciplinary Treatment and Diagnostic Plan Update  07/27/2019 Time of Session: 10:00am Elizabeth Waters MRN: [id number]  Principal Diagnosis: <principal problem not specified>  Secondary Diagnoses: Active Problems:   Psychosis ([place])   Current Medications:  Current Facility-Administered Medications  Medication Dose Route Frequency Provider Last Rate Last Dose  . acetaminophen (TYLENOL) tablet 650 mg  650 mg Oral Q6H PRN Connye Burkitt, NP      . albuterol (VENTOLIN HFA) 108 (90 Base) MCG/ACT inhaler 1-2 puff  1-2 puff Inhalation Q4H PRN Connye Burkitt, NP      . alum & mag hydroxide-simeth (MAALOX/MYLANTA) 200-200-20 MG/5ML suspension 30 mL  30 mL Oral Q4H PRN Connye Burkitt, NP      . hydrOXYzine (ATARAX/VISTARIL) tablet 25 mg  25 mg Oral TID PRN Connye Burkitt, NP   25 mg at 07/25/19 2355  . magnesium hydroxide (MILK OF MAGNESIA) suspension 30 mL  30 mL Oral Daily PRN Connye Burkitt, NP      . modafinil (PROVIGIL) tablet 100 mg  100 mg Oral Daily Johnn Hai, MD   100 mg at 07/27/19 0810  . prenatal multivitamin tablet 1 tablet  1 tablet Oral Daily Johnn Hai, MD   1 tablet at 07/27/19 [phone]  . traZODone (DESYREL) tablet 50 mg  50 mg Oral QHS PRN Connye Burkitt, NP   50 mg at 07/25/19 2354  . vortioxetine HBr (TRINTELLIX) tablet 10 mg  10 mg Oral Daily Johnn Hai, MD   10 mg at 07/27/19 7035  . zolpidem (AMBIEN) tablet 10 mg  10 mg Oral QHS Johnn Hai, MD   10 mg at 07/26/19 2111   PTA Medications: Medications Prior to Admission  Medication Sig Dispense Refill Last Dose  . albuterol (PROVENTIL HFA;VENTOLIN HFA) 108 (90 Base) MCG/ACT inhaler Inhale 2 puffs into the lungs every 4 (four) hours as needed for wheezing or shortness of breath. (Patient not taking: Reported on 07/26/2019) 2 Inhaler 0 Not Taking at Unknown time  . FLUoxetine (PROZAC) 40 MG capsule TAKE 1 CAPSULE BY MOUTH DAILY. (Patient not taking: No sig reported) 30 capsule 2 Not Taking at Unknown time  . hydrOXYzine  (ATARAX/VISTARIL) 25 MG tablet Take 1 tablet (25 mg total) at bedtime as needed by mouth (insomnia). (Patient not taking: Reported on 11/03/2017) 30 tablet 0 Not Taking at Unknown time    Patient Stressors: Other: Disturbed thought process  Patient Strengths: Ability for insight Communication skills General fund of knowledge Physical Health  Treatment Modalities: Medication Management, Group therapy, Case management,  1 to 1 session with clinician, Psychoeducation, Recreational therapy.   Physician Treatment Plan for Primary Diagnosis: <principal problem not specified> Long Term Goal(s): Improvement in symptoms so as ready for discharge Improvement in symptoms so as ready for discharge   Short Term Goals: Ability to identify changes in lifestyle to reduce recurrence of condition will improve Ability to verbalize feelings will improve Ability to disclose and discuss suicidal ideas Ability to demonstrate self-control will improve Ability to identify and develop effective coping behaviors will improve Ability to demonstrate self-control will improve Ability to identify and develop effective coping behaviors will improve Ability to maintain clinical measurements within normal limits will improve Compliance with prescribed medications will improve  Medication Management: Evaluate patient's response, side effects, and tolerance of medication regimen.  Therapeutic Interventions: 1 to 1 sessions, Unit Group sessions and Medication administration.  Evaluation of Outcomes: Adequate for Discharge  Physician Treatment Plan for Secondary Diagnosis: Active Problems:  Psychosis (HCC)  Long Term Goal(s): Improvement in symptoms so as ready for discharge Improvement in symptoms so as ready for discharge   Short Term Goals: Ability to identify changes in lifestyle to reduce recurrence of condition will improve Ability to verbalize feelings will improve Ability to disclose and discuss suicidal  ideas Ability to demonstrate self-control will improve Ability to identify and develop effective coping behaviors will improve Ability to demonstrate self-control will improve Ability to identify and develop effective coping behaviors will improve Ability to maintain clinical measurements within normal limits will improve Compliance with prescribed medications will improve     Medication Management: Evaluate patient's response, side effects, and tolerance of medication regimen.  Therapeutic Interventions: 1 to 1 sessions, Unit Group sessions and Medication administration.  Evaluation of Outcomes: Adequate for Discharge   RN Treatment Plan for Primary Diagnosis: <principal problem not specified> Long Term Goal(s): Knowledge of disease and therapeutic regimen to maintain health will improve  Short Term Goals: Ability to verbalize feelings will improve, Ability to identify and develop effective coping behaviors will improve and Compliance with prescribed medications will improve  Medication Management: RN will administer medications as ordered by provider, will assess and evaluate patient's response and provide education to patient for prescribed medication. RN will report any adverse and/or side effects to prescribing provider.  Therapeutic Interventions: 1 on 1 counseling sessions, Psychoeducation, Medication administration, Evaluate responses to treatment, Monitor vital signs and CBGs as ordered, Perform/monitor CIWA, COWS, AIMS and Fall Risk screenings as ordered, Perform wound care treatments as ordered.  Evaluation of Outcomes: Adequate for Discharge   LCSW Treatment Plan for Primary Diagnosis: <principal problem not specified> Long Term Goal(s): Safe transition to appropriate next level of care at discharge, Engage patient in therapeutic group addressing interpersonal concerns.  Short Term Goals: Engage patient in aftercare planning with referrals and resources, Increase social  support, Increase emotional regulation, Identify triggers associated with mental health/substance abuse issues and Increase skills for wellness and recovery  Therapeutic Interventions: Assess for all discharge needs, 1 to 1 time with Social worker, Explore available resources and support systems, Assess for adequacy in community support network, Educate family and significant other(s) on suicide prevention, Complete Psychosocial Assessment, Interpersonal group therapy.  Evaluation of Outcomes: Adequate for Discharge  Progress in Treatment: Attending groups: Yes. Participating in groups: Yes. Taking medication as prescribed: Yes. Toleration medication: Yes. Family/Significant other contact made: No, will contact:  declined consents, SPE reviewed with patient. Patient understands diagnosis: Yes. Discussing patient identified problems/goals with staff: Yes. Medical problems stabilized or resolved: Yes. Denies suicidal/homicidal ideation: Yes. Issues/concerns per patient self-inventory: No.  New problem(s) identified: No, Describe:  none.  New Short Term/Long Term Goal(s): medication management for mood stabilization; elimination of SI thoughts; development of comprehensive mental wellness/sobriety plan.  Patient Goals:  "Go home."  Discharge Plan or Barriers: Returning home with family, following up with [facility].  Reason for Continuation of Hospitalization: Anxiety Medication stabilization  Estimated Length of Stay: discharge today  Attendees: Patient: Elizabeth Waters 07/27/2019 10:07 AM  Physician: Dr.Farah 07/27/2019 10:07 AM  Nursing: Marton Redwood, RN 07/27/2019 10:07 AM  RN Care Manager: 07/27/2019 10:07 AM  Social Worker: Enid Cutter, LCSWA 07/27/2019 10:07 AM  Recreational Therapist:  07/27/2019 10:07 AM  Other:  07/27/2019 10:07 AM  Other:  07/27/2019 10:07 AM  Other: 07/27/2019 10:07 AM    Scribe for Treatment Team: Darreld Mclean, LCSWA 07/27/2019 10:07 AM

## 2019-07-27 NOTE — Progress Notes (Signed)
Adult Psychoeducational Group Note  Date:  07/27/2019 Time:  2:26 AM  Group Topic/Focus:  Wrap-Up Group:   The focus of this group is to help patients review their daily goal of treatment and discuss progress on daily workbooks.  Participation Level:  Minimal  Participation Quality:  Appropriate  Affect:  Appropriate  Cognitive:  Appropriate  Insight: Appropriate  Engagement in Group:  Developing/Improving  Modes of Intervention:  Discussion  Additional Comments: Pt stated her goal for today was to focus on her treatment plan. Pt stated she accomplished her goal today. Pt stated her relationship with her family has improved since she was admitted here. Pt stated been able to contact her mother today help improved her day. Pt stated she felt better about herself today. Pt rated her overall day a 6 out of 10. Pt stated her appetite was pretty good today. Pt stated her goal for tonight was to get some sleep.  Pt stated she was in no physical pain tonight. Pt stated she was not hearing or seeing anything that was not there. Pt stated she had no thoughts of harming herself or others. Pt stated if anything change she would alert staff.  Candy Sledge 07/27/2019, 2:26 AM

## 2020-04-26 NOTE — Progress Notes (Signed)
SUBJECTIVE:   CHIEF COMPLAINT / HPI:   Prolonged menstrual periods: Patient reports that she is now on day 35 of her menstrual cycle.  Denies being on any form of birth control, no concerning history of symptomatic anemia, however would benefit from having CBC drawn today to check her hemoglobin.  Additionally, patient is interested in a form of birth control which can hopefully help to reduce her menstrual periods.  She is opting for Depo shot until she can have an implantable Nexplanon or IUD.  Depression: Patient's PHQ-9 score today is 19, answer to question #9 is a 3.  Reports that she has a history of contemplating suicidality.  Endorses more of a passive suicidal ideation, however it is concerning how frequently she thinks about this.  Has been hospitalized for this before.  Is not currently on any medications, or seeing a counselor.  It appears patient was previously on Trintellix which worked well for her.  Dr. Shawnee Knapp came into clinic to speak with the patient, see her note for further details.  Sensation of tingling in both feet  plantar fasciitis: Patient reports she has had the sensation for the last several years, reports it is worse after she has been standing for a long time, particularly when she starts to walk in a direction.  Reports it is also bad when she first upset about in the morning.  Sensation improves/goes away the more she walks.  Denies any traumas, falls, injuries.  Has not changed her shoes, unaware of any other triggers for the pain/sensation.  Last HbA1c within normal limits.   Health Maintenance: patient due for Tetanus shot, Flu shot, Hep C and HIV screening.  PERTINENT  PMH / PSH:  Patient Active Problem List   Diagnosis Date Noted  . Need for immunization against influenza 05/01/2020  . anemia (HCC) 05/01/2020  . Encounter for initial prescription of injectable contraceptive 05/01/2020  . Depression, major, single episode, severe (HCC) 05/01/2020  .  Prolonged menstrual cycle 05/01/2020  . Screening for HIV (human immunodeficiency virus) 05/01/2020  . Encounter for hepatitis C screening test for low risk patient 05/01/2020  . Plantar fasciitis, bilateral 05/01/2020  . Major depressive disorder, single episode, severe with psychotic features (HCC) 07/27/2019  . Psychosis (HCC) 07/25/2019  . Depression with suicidal ideation   . Severe recurrent major depressive disorder with psychotic features (HCC) 06/24/2017  . Suicidal ideation 12/06/2016  . 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 Extrinsic asthma with exacerbation 10/21/2016  . Asthma exacerbation 10/21/2016     OBJECTIVE:   BP 118/64   Pulse 73   Ht 5\' 3"  (1.6 m)   Wt 239 lb 8 oz (108.6 kg)   LMP 03/24/2020 Comment: still currently on  SpO2 99%   BMI 42.43 kg/m    Physical exam: General: Pleasant patient, somewhat flat affect Respiratory: CTA bilaterally, comfortable work of breathing Cardio: RRR, S1-S2 present, no murmurs appreciated  ASSESSMENT/PLAN:   Need for immunization against influenza -Patient received flu shot at today's visit  MDD (major depressive disorder), recurrent severe, without psychosis (HCC) PHQ-9 score today 19, question 9 answers 3.  Dr. 03/26/2020 spoke with patient, denies any current suicidal ideation.  Additionally, patient given plenty of resources regarding acute suicidal ideation and follow-up with a counselor. -Patient started on Trintellix today -To follow-up in 2 weeks  Encounter for initial prescription of injectable contraceptive Patient with prolonged menstrual cycle.  Is interested in form of birth control  for controlling her periods -Depo shot administered today while patient contemplates desire for IUD versus Nexplanon -Follow-up 2 weeks -CBC ordered, was not drawn during clinic  Encounter for hepatitis C screening test for low risk patient -Hep C antibody test ordered at today's  visit, was not drawn -Patient to follow-up in 2 weeks  Screening for HIV (human immunodeficiency virus) -HIV antibody testing ordered at today's visit, was not drawn -Follow-up 2 weeks  Plantar fasciitis, bilateral Patient with history of sharp tingling in both of her feet that is especially worse when she gets out of bed in the morning and when she starts walking more after she has been standing for a while.  Strong suspicion for plantar fasciitis.  Patient's most recent HbA1c was within normal limits, little to no concern for peripheral neuropathy. -Patient given instructions regarding plantar fasciitis for ruling out feet, wearing good arch support -Follow-up 2 weeks     Dollene Cleveland, DO [facility]

## 2020-04-27 ENCOUNTER — Other Ambulatory Visit: Payer: Self-pay

## 2020-04-27 ENCOUNTER — Encounter: Payer: Self-pay | Admitting: Family Medicine

## 2020-04-27 ENCOUNTER — Ambulatory Visit (INDEPENDENT_AMBULATORY_CARE_PROVIDER_SITE_OTHER): Payer: 59 | Admitting: Family Medicine

## 2020-04-27 VITALS — BP 118/64 | HR 73 | Ht 63.0 in | Wt 239.5 lb

## 2020-04-27 DIAGNOSIS — Z23 Encounter for immunization: Secondary | ICD-10-CM

## 2020-04-27 DIAGNOSIS — Z01419 Encounter for gynecological examination (general) (routine) without abnormal findings: Secondary | ICD-10-CM

## 2020-04-27 DIAGNOSIS — F332 Major depressive disorder, recurrent severe without psychotic features: Secondary | ICD-10-CM

## 2020-04-27 DIAGNOSIS — Z30013 Encounter for initial prescription of injectable contraceptive: Secondary | ICD-10-CM | POA: Diagnosis not present

## 2020-04-27 DIAGNOSIS — Z114 Encounter for screening for human immunodeficiency virus [HIV]: Secondary | ICD-10-CM

## 2020-04-27 DIAGNOSIS — N921 Excessive and frequent menstruation with irregular cycle: Secondary | ICD-10-CM

## 2020-04-27 DIAGNOSIS — F322 Major depressive disorder, single episode, severe without psychotic features: Secondary | ICD-10-CM

## 2020-04-27 DIAGNOSIS — M722 Plantar fascial fibromatosis: Secondary | ICD-10-CM

## 2020-04-27 DIAGNOSIS — D619 Aplastic anemia, unspecified: Secondary | ICD-10-CM

## 2020-04-27 DIAGNOSIS — Z1159 Encounter for screening for other viral diseases: Secondary | ICD-10-CM

## 2020-04-27 LAB — POCT URINE PREGNANCY: Preg Test, Ur: NEGATIVE

## 2020-04-27 MED ORDER — VORTIOXETINE HBR 5 MG PO TABS: 5.0000 mg | ORAL_TABLET | Freq: Every day | ORAL | 3 refills | Status: DC

## 2020-04-27 MED ORDER — MEDROXYPROGESTERONE ACETATE 150 MG/ML IM SUSP
150.0000 mg | Freq: Once | INTRAMUSCULAR | Status: AC
  Administered 2020-04-27: 150 mg via INTRAMUSCULAR

## 2020-04-27 MED ORDER — MEDROXYPROGESTERONE ACETATE 150 MG/ML IM SUSP: 150.0000 mg | Freq: Once | INTRAMUSCULAR | Status: DC

## 2020-04-27 NOTE — Patient Instructions (Addendum)
Thank you for coming in to see [place] today! Please see below to review our plan for today's visit:  1. Start taking Trintellix 5 mg daily.  2. We are checking your blood levels, as well as Hep C and HIV for routine screening.  3. Please follow up in 2 weeks.   Please call the clinic at [phone] if your symptoms worsen or you have any concerns. It was our pleasure to serve you!   Dr. Peggyann Shoals [facility]    Plantar Fasciitis  Plantar fasciitis is a painful foot condition that affects the heel. It occurs when the band of tissue that connects the toes to the heel bone (plantar fascia) becomes irritated. This can happen as the result of exercising too much or doing other repetitive activities (overuse injury). The pain from plantar fasciitis can range from mild irritation to severe pain that makes it difficult to walk or move. The pain is usually worse in the morning after sleeping, or after sitting or lying down for a while. Pain may also be worse after long periods of walking or standing. What are the causes? This condition may be caused by:  Standing for long periods of time.  Wearing shoes that do not have good arch support.  Doing activities that put stress on joints (high-impact activities), including running, aerobics, and ballet.  Being overweight.  An abnormal way of walking (gait).  Tight muscles in the back of your lower leg (calf).  High arches in your feet.  Starting a new athletic activity. What are the signs or symptoms? The main symptom of this condition is heel pain. Pain may:  Be worse with first steps after a time of rest, especially in the morning after sleeping or after you have been sitting or lying down for a while.  Be worse after long periods of standing still.  Decrease after 30-45 minutes of activity, such as gentle walking. How is this diagnosed? This condition may be diagnosed based on your medical history and your  symptoms. Your health care provider may ask questions about your activity level. Your health care provider will do a physical exam to check for:  A tender area on the bottom of your foot.  A high arch in your foot.  Pain when you move your foot.  Difficulty moving your foot. You may have imaging tests to confirm the diagnosis, such as:  X-rays.  Ultrasound.  MRI. How is this treated? Treatment for plantar fasciitis depends on how severe your condition is. Treatment may include:  Rest, ice, applying pressure (compression), and raising the affected foot (elevation). This may be called RICE therapy. Your health care provider may recommend RICE therapy along with over-the-counter pain medicines to manage your pain.  Exercises to stretch your calves and your plantar fascia.  A splint that holds your foot in a stretched, upward position while you sleep (night splint).  Physical therapy to relieve symptoms and prevent problems in the future.  Injections of steroid medicine (cortisone) to relieve pain and inflammation.  Stimulating your plantar fascia with electrical impulses (extracorporeal shock wave therapy). This is usually the last treatment option before surgery.  Surgery, if other treatments have not worked after 12 months. Follow these instructions at home:  Managing pain, stiffness, and swelling  If directed, put ice on the painful area: ? Put ice in a plastic bag, or use a frozen bottle of water. ? Place a towel between your skin and the bag or  bottle. ? Roll the bottom of your foot over the bag or bottle. ? Do this for 20 minutes, 2-3 times a day.  Wear athletic shoes that have air-sole or gel-sole cushions, or try wearing soft shoe inserts that are designed for plantar fasciitis.  Raise (elevate) your foot above the level of your heart while you are sitting or lying down. Activity  Avoid activities that cause pain. Ask your health care provider what activities are  safe for you.  Do physical therapy exercises and stretches as told by your health care provider.  Try activities and forms of exercise that are easier on your joints (low-impact). Examples include swimming, water aerobics, and biking. General instructions  Take over-the-counter and prescription medicines only as told by your health care provider.  Wear a night splint while sleeping, if told by your health care provider. Loosen the splint if your toes tingle, become numb, or turn cold and blue.  Maintain a healthy weight, or work with your health care provider to lose weight as needed.  Keep all follow-up visits as told by your health care provider. This is important. Contact a health care provider if you:  Have symptoms that do not go away after caring for yourself at home.  Have pain that gets worse.  Have pain that affects your ability to move or do your daily activities. Summary  Plantar fasciitis is a painful foot condition that affects the heel. It occurs when the band of tissue that connects the toes to the heel bone (plantar fascia) becomes irritated.  The main symptom of this condition is heel pain that may be worse after exercising too much or standing still for a long time.  Treatment varies, but it usually starts with rest, ice, compression, and elevation (RICE therapy) and over-the-counter medicines to manage pain.

## 2020-04-27 NOTE — Progress Notes (Signed)
Dr. Dareen Piano called me consult for pt sharing SI.  S: Pt reported her last thought of SI was Wednesday.  Pt denied plan and intent.  Pt shared she engages in drawing and listening to music when she is feeling this way.  Pt shared she has a friend that is her protective factor. O: Pt was oriented x4.  Pt was engaged and cohesive in thought. A: Pt is experiencing depression and would benefit from therapy and medication management. P: Pt was given info for [facility] in case SI gets worse. Pt was given SI hotline and [email or web address] to find a therapist. Pt has f/u with Dr. Dareen Piano in 2 weeks.

## 2020-05-01 DIAGNOSIS — M722 Plantar fascial fibromatosis: Secondary | ICD-10-CM | POA: Insufficient documentation

## 2020-05-01 DIAGNOSIS — F322 Major depressive disorder, single episode, severe without psychotic features: Secondary | ICD-10-CM

## 2020-05-01 DIAGNOSIS — Z30013 Encounter for initial prescription of injectable contraceptive: Secondary | ICD-10-CM | POA: Insufficient documentation

## 2020-05-01 DIAGNOSIS — N921 Excessive and frequent menstruation with irregular cycle: Secondary | ICD-10-CM | POA: Insufficient documentation

## 2020-05-01 DIAGNOSIS — D619 Aplastic anemia, unspecified: Secondary | ICD-10-CM | POA: Insufficient documentation

## 2020-05-01 HISTORY — DX: Major depressive disorder, single episode, severe without psychotic features: F32.2

## 2020-05-01 NOTE — Assessment & Plan Note (Signed)
Patient with prolonged menstrual cycle.  Is interested in form of birth control for controlling her periods -Depo shot administered today while patient contemplates desire for IUD versus Nexplanon -Follow-up 2 weeks -CBC ordered, was not drawn during clinic

## 2020-05-01 NOTE — Assessment & Plan Note (Signed)
Patient with history of sharp tingling in both of her feet that is especially worse when she gets out of bed in the morning and when she starts walking more after she has been standing for a while.  Strong suspicion for plantar fasciitis.  Patient's most recent HbA1c was within normal limits, little to no concern for peripheral neuropathy. -Patient given instructions regarding plantar fasciitis for ruling out feet, wearing good arch support -Follow-up 2 weeks

## 2020-05-01 NOTE — Assessment & Plan Note (Signed)
-  HIV antibody testing ordered at today's visit, was not drawn -Follow-up 2 weeks

## 2020-05-01 NOTE — Assessment & Plan Note (Signed)
PHQ-9 score today 19, question 9 answers 3.  Dr. Shawnee Knapp spoke with patient, denies any current suicidal ideation.  Additionally, patient given plenty of resources regarding acute suicidal ideation and follow-up with a counselor. -Patient started on Trintellix today -To follow-up in 2 weeks

## 2020-05-01 NOTE — Assessment & Plan Note (Signed)
-  Hep C antibody test ordered at today's visit, was not drawn -Patient to follow-up in 2 weeks

## 2020-05-01 NOTE — Assessment & Plan Note (Signed)
-  Patient received flu shot at today's visit 

## 2020-05-04 ENCOUNTER — Telehealth: Payer: Self-pay

## 2020-05-04 NOTE — Telephone Encounter (Signed)
Received phone call from patient regarding pharmacy never receiving trintellix rx. Called and verified this with pharmacy. Gave verbal instruction per Dr. Ewell Poe written order.   Veronda Prude, RN

## 2020-05-07 NOTE — Telephone Encounter (Signed)
Called patient's mother to check on prescription status. Per mother, medication needs a PA, as medication is over $500 without.   Spoke with Jazmin at pharmacy. Confirmed that PA is required. She will be faxing PA form to office.   Veronda Prude, RN'

## 2020-05-08 ENCOUNTER — Other Ambulatory Visit: Payer: Self-pay | Admitting: Family Medicine

## 2020-05-08 ENCOUNTER — Other Ambulatory Visit: Payer: Self-pay

## 2020-05-08 MED ORDER — VORTIOXETINE HBR 5 MG PO TABS: 5.0000 mg | ORAL_TABLET | Freq: Every day | ORAL | 3 refills | Status: DC

## 2020-06-13 ENCOUNTER — Other Ambulatory Visit: Payer: Self-pay

## 2020-06-13 ENCOUNTER — Other Ambulatory Visit: Payer: Self-pay | Admitting: Family Medicine

## 2020-06-13 MED ORDER — VORTIOXETINE HBR 5 MG PO TABS: 5.0000 mg | ORAL_TABLET | Freq: Every day | ORAL | 3 refills | Status: DC

## 2020-06-13 NOTE — Telephone Encounter (Signed)
Received fax from pharmacy, PA needed on Trillentix. Clinical questions submitted via Cover My Meds. Waiting on response, could take up to 72 hours.  Cover My Meds info: Key: [id number]

## 2020-06-13 NOTE — Telephone Encounter (Signed)
I spoke with covermymeds and Vortioxentine is not available. Can we send in the brand Trintellix?

## 2020-06-14 MED FILL — TRINTELLIX 5 MG TABLET: 5 | 30 days supply | Qty: 30 | Fill #0

## 2020-06-14 NOTE — Telephone Encounter (Signed)
Medication approved until 06/13/2020. The pharmacy has been updated. The patient can purchase this medication going forward for 10 dollars.

## 2020-06-18 MED FILL — TRINTELLIX 5 MG TABLET: 5 | 30 days supply | Qty: 30 | Fill #0

## 2020-07-18 ENCOUNTER — Ambulatory Visit (INDEPENDENT_AMBULATORY_CARE_PROVIDER_SITE_OTHER): Payer: 59 | Admitting: Family Medicine

## 2020-07-18 ENCOUNTER — Encounter: Payer: Self-pay | Admitting: Family Medicine

## 2020-07-18 ENCOUNTER — Ambulatory Visit (HOSPITAL_COMMUNITY)
Admission: EM | Admit: 2020-07-18 | Discharge: 2020-07-18 | Disposition: A | Discharge status: Home / Self Care | Payer: 59 | Attending: Psychiatry | Admitting: Psychiatry

## 2020-07-18 ENCOUNTER — Encounter (HOSPITAL_COMMUNITY): Payer: Self-pay | Admitting: Behavioral Health

## 2020-07-18 ENCOUNTER — Other Ambulatory Visit: Payer: Self-pay

## 2020-07-18 ENCOUNTER — Other Ambulatory Visit (HOSPITAL_COMMUNITY): Payer: Self-pay | Admitting: Psychiatry

## 2020-07-18 VITALS — BP 122/74 | HR 88 | Ht 63.0 in | Wt 224.0 lb

## 2020-07-18 DIAGNOSIS — Z7189 Other specified counseling: Secondary | ICD-10-CM | POA: Diagnosis not present

## 2020-07-18 DIAGNOSIS — Z3042 Encounter for surveillance of injectable contraceptive: Secondary | ICD-10-CM

## 2020-07-18 DIAGNOSIS — R45851 Suicidal ideations: Secondary | ICD-10-CM | POA: Diagnosis not present

## 2020-07-18 DIAGNOSIS — F32A Depression, unspecified: Secondary | ICD-10-CM | POA: Diagnosis not present

## 2020-07-18 DIAGNOSIS — F332 Major depressive disorder, recurrent severe without psychotic features: Secondary | ICD-10-CM | POA: Diagnosis not present

## 2020-07-18 DIAGNOSIS — Z23 Encounter for immunization: Secondary | ICD-10-CM | POA: Diagnosis not present

## 2020-07-18 DIAGNOSIS — Z7185 Encounter for immunization safety counseling: Secondary | ICD-10-CM

## 2020-07-18 MED ORDER — HYDROXYZINE HCL 25 MG PO TABS: 25.0000 mg | ORAL_TABLET | Freq: Three times a day (TID) | ORAL | 0 refills | Status: DC | PRN

## 2020-07-18 MED ORDER — TRAZODONE HCL 50 MG PO TABS: 50.0000 mg | ORAL_TABLET | Freq: Every evening | ORAL | 0 refills | Status: DC | PRN

## 2020-07-18 MED ORDER — MEDROXYPROGESTERONE ACETATE 150 MG/ML IM SUSY
150.0000 mg | PREFILLED_SYRINGE | Freq: Once | INTRAMUSCULAR | Status: AC
  Administered 2020-07-18: 150 mg via INTRAMUSCULAR

## 2020-07-18 MED ORDER — VORTIOXETINE HBR 10 MG PO TABS: 10.0000 mg | ORAL_TABLET | Freq: Every day | ORAL | 0 refills | Status: DC

## 2020-07-18 MED FILL — TRINTELLIX 10 MG TABLET: 10 | 30 days supply | Qty: 30 | Fill #0

## 2020-07-18 MED FILL — traZODone HCL 50 MG TABS: 50 | 30 days supply | Qty: 30 | Fill #0

## 2020-07-18 MED FILL — hydrOXYzine HCL 25 MG TABS: 25 | 20 days supply | Qty: 60 | Fill #0
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
## 2020-07-18 NOTE — ED Provider Notes (Addendum)
Behavioral Health Urgent Care Medical Screening Exam  Patient Name: Elizabeth Waters MRN: [id number] Date of Evaluation: 07/18/20 Chief Complaint:   Diagnosis:  Final diagnoses:  MDD (major depressive disorder), recurrent severe, without psychosis (HCC)    History of Present illness: Elizabeth Waters is a 19 y.o. female.  Patient resents voluntarily to the [facility] as a walk-in reporting a history of MDD with chronic suicidal ideations.  She states that today she was at her doctor's office and her mom became upset because they were waiting too long and she had a panic attack and had some suicidal ideations.  Patient states that she has chronically suicidal and has no plan or intent.  She states that after seeing her doctor her doctor requested that she come here for evaluation.  She reports that her last hospitalization was in November 2020 at [facility] and she was started on Trintellix and when she discharged she had continued to Trintellix 10 mg p.o. daily and then it seemed to work for for a while and she is going to [place] and then lost services with him.  She reports that last month her PCP had started her on Trintellix 5 mg and has not been increased yet.  She reports that a lot of her stressors come from her family and reports that both of her parents are narcissistic.  She reports that she does not feel that she can stay employed because in the work environment she deals with a lot of stress and anxiety and is unable to complete jobs.  She reports that she is trying to get her disability. Patient is pleasant, calm, and cooperative.  She does not appear to be responding to any internal or external stimuli.  She denies having any homicidal ideations and denies any hallucinations.  Currently she states that "always have suicidal thoughts but I am good right now." Discussed medications with patient as well as follow-up.  I have agreed to increase patient's Trintellix to 10 mg p.o. daily and to restart Vistaril  25 mg p.o. 3 times daily as needed for anxiety and trazodone 50 mg p.o. nightly as needed for insomnia.  She reports that she is interested in getting back in with psychiatric treatment and the recommendation for PHP was made and the patient is in agreement with it.  TTS staff is sending referral for patient to go to [facility] program. Patient does not meet inpatient psychiatric treatment criteria and is currently stating that she is not having any suicidal ideations with plan or intent.  She does report chronic suicidal ideations.  She lives at home with her family and states that she is safe there.  She has agreed to follow-up with PHP and I have sent prescriptions for her medications to pharmacy of choice.  Psychiatric Specialty Exam  Presentation  General Appearance:Appropriate for Environment;Casual;Well Groomed  Eye Contact:Good  Speech:Clear and Coherent;Normal Rate  Speech Volume:Normal  Handedness:Right   Mood and Affect  Mood:Euthymic  Affect:Appropriate;Congruent   Thought Process  Thought Processes:Coherent  Descriptions of Associations:Intact  Orientation:Full (Time, Place and Person)  Thought Content:WDL  Hallucinations:None  Ideas of Reference:None  Suicidal Thoughts:Yes, Passive (chronically passive SI) Without Intent;Without Plan  Homicidal Thoughts:No   Sensorium  Memory:Immediate Good;Recent Good;Remote Good  Judgment:Good  Insight:Good   Executive Functions  Concentration:Good  Attention Span:Good  Recall:Good  Fund of Knowledge:Good  Language:Good   Psychomotor Activity  Psychomotor Activity:Normal   Assets  Assets:Communication Skills;Desire for Improvement;Financial Resources/Insurance;Housing;Physical Health;Social Support;Transportation  Sleep  Sleep:Good  Number of hours: No data recorded  Physical Exam: Physical Exam Vitals and nursing note reviewed.  Constitutional:      Appearance: She is well-developed.   HENT:     Head: Normocephalic.  Eyes:     Pupils: Pupils are equal, round, and reactive to light.  Cardiovascular:     Rate and Rhythm: Normal rate.  Pulmonary:     Effort: Pulmonary effort is normal.  Musculoskeletal:        General: Normal range of motion.  Neurological:     Mental Status: She is alert and oriented to person, place, and time.    Review of Systems  Constitutional: Negative.   HENT: Negative.   Eyes: Negative.   Respiratory: Negative.   Cardiovascular: Negative.   Gastrointestinal: Negative.   Genitourinary: Negative.   Musculoskeletal: Negative.   Skin: Negative.   Neurological: Negative.   Endo/Heme/Allergies: Negative.   Psychiatric/Behavioral: Positive for depression and suicidal ideas (Reports chronic suicidal ideations).   Blood pressure (!) 137/100, pulse 83, temperature 97.8 F (36.6 C), temperature source Oral, resp. rate 18, height 5\' 3"  (1.6 m), weight 234 lb (106.1 kg). Body mass index is 41.45 kg/m.  Musculoskeletal: Strength & Muscle Tone: within normal limits Gait & Station: normal Patient leans: N/A   [facility] MSE Discharge Disposition for Follow up and Recommendations: Based on my evaluation the patient does not appear to have an emergency medical condition and can be discharged with resources and follow up care in outpatient services for Medication Management, Partial Hospitalization Program and Individual Therapy   , FNP 07/18/2020, 2:58 PM

## 2020-07-18 NOTE — Progress Notes (Signed)
SUBJECTIVE:   CHIEF COMPLAINT / HPI:   Depression with SI: Patient reports depressive symptoms since the age of 75.  She has difficulty with intrusive thoughts, which are more intense at night, that consist of a thought of hanging herself, jumping off a bridge, and other suicidal acts.  She reports that she does not act on these thoughts because she is afraid that the attempt will not work and that she "does not want to inconvenience other people who will have to clean up the mess".  She reports that she has a history of suicidal ideation with plans for attempt in the past-in high school she was going to attempt cyanide poisoning and also debated jumping from building but was seen by people and so she did not complete this.  She reports that she has been involuntarily committed to the hospital three separate times, with the most recent one being in the last year.  She feels that the last time she had to go on it was more disorganized and that she felt like she had to lie to get out.  Patient reports a history of a lot of family trauma and states that her father is "narcissistic" and that her brother also has some traits, and also states that her mother creates a toxic environment in which she contributes to lowering the patient's mood. Current medications: Trintellix  Current stressors: Family, nightmares, anxiety with being in car.  Stressor of the holiday with family coming to visit and needing to be the main person cooking food-also states that her mother is creating extra stress which translates to her. Protective factors: Best friend is Dondra Prader, she states she contact to every single day and sometimes at night. Coping mechanisms: Patient states she "goes into another world in my mind" where she has different friends, boyfriend, is able to go out and do "things people my age like to do" Therapy: Currently not in therapy, was previously seeing a provider at [facility] SI/HI: Reports no active  plan currently but does have many intrusive thoughts about how to commit suicide and passive SI as well.  Reports no HI.  Contraceptive counseling: Patient currently taking Depo, states that she is concerned that she is still having longer periods of bleeding.  Otherwise patient is having no problems with the contraception and is getting her second shot.  Health maintenance Patient desires getting the HPV vaccine series.   PERTINENT  PMH / PSH: Suicidal ideation, severe recurrent major depressive disorder with psychotic features  OBJECTIVE:   BP 122/74   Pulse 88   Ht 5\' 3"  (1.6 m)   Wt 224 lb (101.6 kg)   SpO2 96%   BMI 39.68 kg/m   Gen: sitting in clinic NAD, well-nourished Psych: A&Ox4, mildly flat affect, engages well in conversation and makes eye contact  ASSESSMENT/PLAN:   Depression with suicidal ideation Current SI with no active plan or intent reported.  PHQ-9 score is 20 with #9 reported as every day SI. Discussed at length with patient the importance of her own mental health and ensuring that she gets appropriate treatment.  Patient voices some reluctance due to added stress on the family from a holiday and her role in Thanksgiving-discussed that patient likely would benefit from ensuring her own mental safety versus the need from the family.  -Patient given resources for therapy -Continuing Trintellix, consideration for increasing dose (prescribed by our office will require PA) -Patient advised to go to health for assessment and  consideration for possible inpatient treatment if appropriate.    Health maintenance: -HPV vaccine given today  Contraceptive counseling: -Received Depo shot today. -Discussed other forms of birth control and handout given to patient.  Evelena Leyden, DO [facility]

## 2020-07-18 NOTE — Assessment & Plan Note (Addendum)
Current SI with no active plan or intent reported.  PHQ-9 score is 20 with #9 reported as every day SI. Discussed at length with patient the importance of her own mental health and ensuring that she gets appropriate treatment.  Patient voices some reluctance due to added stress on the family from a holiday and her role in Thanksgiving-discussed that patient likely would benefit from ensuring her own mental safety versus the need from the family.  -Patient given resources for therapy -Continuing Trintellix, consideration for increasing dose (prescribed by our office will require PA) -Patient advised to go to [facility] behavioral health for assessment and consideration for possible inpatient treatment if appropriate.

## 2020-07-18 NOTE — Patient Instructions (Addendum)
I am so glad that you came into the office today.  I am very concerned about your mental wellbeing as you have consistently had suicidal ideation and ideas/thoughts about hurting yourself.  I do think that you would benefit from going to the [facility] in order to be further evaluated to see if they can help you with medications as well as if they think you would need any further treatment.  The medication may be prescribed on required a prior authorization, and to increase the dosage we would need to do that again (which can be an unlikely process).  If you have access to psychiatry, they will be able to get this done quicker for you. I do to get very important to start putting some of the priority, especially when dealing with the mental health aspect.  You have to start putting yourself as a priority, and screen very difficult but is the strongest the best that you are able to do for yourself.  Please contact us if you have any concerns, or want a follow-up with me soon.   Attached are a lot of resources for mental health.  Also attached is a quick guide for contraceptive use.  If you decide you no longer wanted to go, we can consider doing another alternative birth control medication.    Outpatient Mental Health Providers (No Insurance required or Self Pay)  [facility]  [address] [place], [place] Front [place] [phone] Crisis [phone]  [facility]([place]) can see uninsured folks for outpatient therapy [email or web address]-[email or web address]/ [address] [place], [place] [address] [phone]  [facility]    Walk-in Mon-Fri, 8am-3pm [email or web address].Gerre [email or web address] [address], [place], [place]  [phone]   [address]  [place] [phone] [facility] [place] [facility] for psych med management, there may be a wait- if [facility]HA is working with clients for OPT, they will coordinate with [facility] for psych  [facility]    Walk-in-Clinic: Monday- Friday 9:00 AM - 4:00 PM [address]   [place], [place] [phone]  [facility] ([employer] Espanol) walk in M-F 8am-12pm and  1pm-3pm [place]- [address]     [phone]  [facility] -[address]  Phone: [phone]  [facility] (Mental Health and substance challenges) [address]   [place] [phone]    [email or web address]    [facility]  [facility] -[address], [place]     Phone:  [phone] [place]-  [address]  [phone]   [facility]  [address] [place]  [phone] [email or web address]   [facility] ( virtual or zoom therapy) [email or web address]  [address]  [place]  [phone]    [facility] [phone]  grief counseling, dementia and caregiver support    [facility] Walk-in MWF 12:30 to 3:00     [address] [place] [address]  [phone]  www.ADSyes.org call to schedule an appointment    [facility]ental [facility]  Wellness Classes ,Support group, Peer support services, [address], [place], [place] [address] [phone]- [phone]  PhotoSolver.plwww.[email or web address]           National Alliance on Mental Illness (NAMI) [place]- Wellness classes, Support groups        [address], [place], [place] [address] [phone]   [email or web address].com.pthttps://www.nami.org/   [facility]  (Psycho-social  Rehabilitation clubhouse, Individual and group therapy) [address] [place], [place] [address]   [phone]  24- Hour Availability:  [facility] [phone] or [phone] * Family Service of the [employer] (Domestic Violence, Rape, etc. )[phone] Vesta Mixer [phone] or [phone] * [facility] [place] [facility] [phone] [phone] (after hours) *Therapeutic Alternative Mobile Crisis Unit  [phone] *[place] National Suicide Hotline 816-195-6513 Len Childs)     Psychiatry Resource List (Adults and Children) Most of these providers will take Medicaid. please consult your insurance for a complete and updated list of available providers. When calling to make an appointment have your insurance information available to confirm you are covered.   BestDay:Psychiatry and Counseling [address] [facility] [place] [place], [place] [address] [phone]  [facility]  [address] [place], [place] [phone] Crisis [phone]   [facility]ics:   [facility]: [address]     [phone]   Sidney Ace: [address] [place]. [place],        [phone] [place]: [address],    [phone] Kathryne [place]per: [address],                   [phone] Children: [facility] [address]         [phone]   [facility]  (Psychiatry only; Adults /children 12 and over, will take Medicaid)  [address] Laurell Josephs [address], [place], [place] [address]       [phone]   [facility] (Psychiatry & counseling ; adults & children ; will take Medicaid [address]  [place] [address]   Go on-line to complete referral ( https://www.[email or web address].org/en/make-a-referral ([phone]   (Spanish therapist)  [facility]  Psychiatry & counseling; Adults and children;  Call Registration prior to scheduling an appointment [phone] [address] [facility] [address]    [place], [place] [address]    [phone]  [facility] (Psychiatry & counseling; adults & children; Medicare no Medicaid)  [address]   [place], [place]  [address]      [phone]    [facility] (up to age 77)  Psychiatry & counseling ,will take Medicaid, must do counseling to receive psychiatry services  [address] [place] [address]        [phone]  [facility] (Psychiatry & counseling; adults & children; will take Medicaid) Will need a referral from provider [address],  [place], [place]  [phone]   [facility] --- Walk-In Mon-Friday 8am-3pm ( will take Medicaid, Psychiatry, Adults & children,  [address], [place], [place]   [phone]   [facility] [place]--, Walk-in M-F 8am-12pm and 1pm -3pm   (Counseling, Psychiatry, will take Medicaid, adults & children)  [address], [place], [place]  [phone]       Therapy and Counseling Resources Most providers on this list will take Medicaid. Patients with commercial insurance or Medicare should contact their insurance company to get a list of in network providers.  BestDay:Psychiatry and Counseling [address] [facility] [place] [place], [place] [address] [phone]  [facility]  [address] Suite [place], [place] [address]      [phone]  [facility] [address]  [place], [place] [address] [phone]  [facility]  [address]  [place], [place] [address]       [phone]      Jovita Kussmaul [facility] [address], [place], [place] [phone]  [facility]:  [address] [place] [phone]  [facility]:  [address] Diener [phone]  [facility] (under & uninsured) [address]   [place] [phone]    [email or web address]    [facility] [address] . [place]    [phone]  [facility] -[address]     Phone:  [phone]     [facility]-  [address]  [phone]   [facility] [address]      [place], [place] [phone]  [facility]: [address] [place],  [place], [place]  [phone]   [facility] (Spanish therapist) https://www.savedfound.org/  [address] [place]  [address]   [place] [address]    [phone]    [facility]   [address],  [place], [place]  [phone]   [facility]  [address] [place]  [phone]  [facility]  [address] [place], [place]        [phone]  Open Access/Walk In Clinic under & uninsured  [facility]  [address] [place], [place] [phone] Crisis [phone]  Family Service of the [place],  (Spanish)   [address], [place]: [phone]) 8:30 - 12; 1 - 2:30  Family Service of the [employer],  [address] [place], [place]    ([phone]):8:30 - 12; 2 - 3PM  [facility] [employer],  [address]  [place]; [phone]):   Mon - Fri 8 AM - 5 PM  [facility] [address] [place]  MWF 12:30 to 3:00 or call to schedule an appointment  [phone]  Specific Provider options Psychology Today  https://www.psychologytoday.com/us 1. click on find a therapist  2. enter your zip code 3. left side and select or tailor a therapist for your specific need.   [facility] Provider Directory [email or web address]  (Medicaid)   Follow all drop down to find a provider  Social Support program Mental Health [place] [phone] or PhotoSolver.pl [address], [place], [place] Recovery support and educational   24- Hour Availability:  .  Marland Kitchen [facility]  . [address] [place], [place] [employer] [phone] Crisis [phone]  . Family Service of the [facility] [phone]  [facility] Service  [phone]   . [facility] [employer]  [phone] (after hours)  . Therapeutic Alternative/Mobile Crisis   [phone]  . [place] National  Suicide Hotline  316-232-2791 (TALK)  . Call 911 or go to emergency room  . [employer]  [phone]);  [place] and [employer]   . Cardinal ACCESS  [phone]); [place], [place], [place], [place], [place], [place], [place]         If you are feeling suicidal or depression symptoms worsen please immediately go to:   24 Hour Availability [facility]  [address] [place], [place] [phone] Crisis [phone]    . If you are thinking about harming yourself or having thoughts of suicide, or if you know someone who is, seek help right away. Marland Kitchen  Call your doctor or mental health care provider. . Call 911 or go to a hospital emergency room to get immediate help, or ask a friend or family member to help you do these things. . Call the [place] National Suicide Prevention Lifeline's toll-free, 24-hour hotline at 1-800-273-TALK 530-556-9598) or TTY: 1-800-799-4 TTY (703)339-9915) to talk to a trained counselor. . If you are in crisis, make sure you are not left alone.  . If someone else is in crisis, make sure he or she is not left alone   Family Service of the [employer] (Domestic Violence, Rape & Victim Assistance [phone]  [facility] [employer] Crisis Services    (ONLY from 8am-4pm)    [phone]  Therapeutic Alternative Mobile Crisis Unit (24/7)   [phone]  [place] National Suicide Hotline   (580)505-9963 (TALK)       Contraception Choices Contraception, also called birth control, means things to use or ways to try not to get pregnant. Hormonal birth control This kind of birth control uses hormones. Here are some types of hormonal birth control:  A tube that is put under skin of the arm (implant). The tube can stay in for as long as 3 years.  Shots to get every 3 months (injections).  Pills to take every day (birth control pills).  A patch to change 1 time each week for 3 weeks (birth control  patch). After that, the patch is taken off for 1 week.  A ring to put in the vagina. The ring is left in for 3 weeks. Then it is taken out of the vagina for 1 week. Then a new ring is put in.  Pills to take after unprotected sex (emergency birth control pills). Barrier birth control Here are some types of barrier birth control:  A thin covering that is put on the penis before sex (female condom). The covering is thrown away after sex.  A soft, loose covering that is put in the vagina before sex (female condom). The covering is thrown away after sex.  A rubber bowl that sits over the cervix (diaphragm). The bowl must be made for you. The bowl is put into the vagina before sex. The bowl is left in for 6-8 hours after sex. It is taken out within 24 hours.  A small, soft cup that fits over the cervix (cervical cap). The cup must be made for you. The cup can be left in for 6-8 hours after sex. It is taken out within 48 hours.  A sponge that is put into the vagina before sex. It must be left in for at least 6 hours after sex. It must be taken out within 30 hours. Then it is thrown away.  A chemical that kills or stops sperm from getting into the uterus (spermicide). It may be a pill, cream, jelly, or foam to put in the vagina. The chemical should be used at least 10-15 minutes before sex. IUD (intrauterine) birth control An IUD is a small, T-shaped piece of plastic. It is put inside the uterus. There are two kinds:  Hormone IUD. This kind can stay in for 3-5 years.  Copper IUD. This kind can stay in for 10 years. Permanent birth control Here are some types of permanent birth control:  Surgery to block the fallopian tubes.  Having an insert put into each fallopian tube.  Surgery to tie off the tubes that carry sperm (vasectomy). Natural planning birth control Here are some types of natural planning birth control:  Not having sex on the days the woman could get pregnant.  Using a  calendar: ? To keep track of the length of each period. ? To find out what days pregnancy can happen. ? To plan to not have sex on days when pregnancy can happen.  Watching for symptoms of ovulation and not having sex during ovulation. One way the woman can check for ovulation is to check her temperature.  Waiting to have sex until after ovulation. Summary  Contraception, also called birth control, means things to use or ways to try not to get pregnant.  Hormonal methods of birth control include implants, injections, pills, patches, vaginal rings, and emergency birth control pills.  Barrier methods of birth control can include female condoms, female condoms, diaphragms, cervical caps, sponges, and spermicides.  There are two types of IUD (intrauterine device) birth control. An IUD can be put in a woman's uterus to prevent pregnancy for 3-5 years.  Permanent sterilization can be done through a procedure for males, females, or both.  Natural planning methods involve not having sex on the days when the woman could get pregnant. This information is not intended to replace advice given to you by your health care provider. Make sure you discuss any questions you have with your health care provider. Document Revised: 12/01/2018 Document Reviewed: 08/21/2016 Elsevier Patient Education  2020 [employer].

## 2020-07-18 NOTE — ED Notes (Signed)
LOCKER #[id number] 

## 2020-07-18 NOTE — ED Triage Notes (Signed)
Patient walk in at the [facility] , complains of having intrusive thoughts telling her to hurt herself. Patient was seen by her physician who referred her to come and be seen.Patient states yes she has thoughts of hurting self but does not have a plan, and does not have a therapist. Patient very tearful stating her stressor are disappointing people and feels her home life is toxic, mother and brother. Patient reporting she sleeps for 13- 14 hours just to not deal with family.

## 2020-07-18 NOTE — Discharge Instructions (Addendum)
Patient is instructed prior to discharge to: Take all medications as prescribed by his/her mental healthcare provider. Report any adverse effects and or reactions from the medicines to his/her outpatient provider promptly. Patient has been instructed & cautioned: To not engage in alcohol and or illegal drug use while on prescription medicines. In the event of worsening symptoms, patient is instructed to call the crisis hotline, 911 and or go to the nearest ED for appropriate evaluation and treatment of symptoms. To follow-up with his/her primary care provider for your other medical issues, concerns and or health care needs.   You have been referred to [facility] for Partial Hospitalization Program (PHP).  You should hear from Jeri Modena by 10AM on Monday, 11/29.  If you don't hear from her by Monday at 10AM, you are encouraged to call the office at [phone].

## 2020-07-25 ENCOUNTER — Ambulatory Visit: Payer: 59

## 2020-07-25 ENCOUNTER — Telehealth (HOSPITAL_COMMUNITY): Payer: Self-pay | Admitting: Family Medicine

## 2020-07-25 NOTE — Telephone Encounter (Signed)
Care Management - Follow Up [facility] Discharges   Writer attempted to make contact with patient today and was unsuccessful.  Writer was able to leave a HIPPA compliant voice message and will await callback.   

## 2020-08-21 ENCOUNTER — Other Ambulatory Visit: Payer: Self-pay

## 2020-08-21 ENCOUNTER — Ambulatory Visit (INDEPENDENT_AMBULATORY_CARE_PROVIDER_SITE_OTHER): Payer: 59

## 2020-08-21 DIAGNOSIS — Z23 Encounter for immunization: Secondary | ICD-10-CM

## 2020-08-22 NOTE — Progress Notes (Signed)
Patient presents to nurse clinic for second HPV vaccine. Administered in LD, site unremarkable, tolerated injection well. Instructed patient to follow up in nurse clinic for last dose around 12/20/2020.   Veronda Prude, RN

## 2020-10-08 ENCOUNTER — Telehealth: Payer: Self-pay

## 2020-10-08 ENCOUNTER — Ambulatory Visit: Payer: Self-pay

## 2020-10-08 ENCOUNTER — Other Ambulatory Visit: Payer: Self-pay

## 2020-10-08 NOTE — Telephone Encounter (Signed)
Patient presents to nurse clinic for depo provera injection and is within her dates. Prior to administration, patient reports that she has been having prolonged spotting/bleeding for the last ~100 days. Patient reports that bleeding has been light but constant.   Advised patient to schedule follow up with PCP to discuss concern further. Patient scheduled with PCP on 2/17. Through shared decision making, patient will hold off depo injection until appointment with provider.   FYI to PCP  Veronda Prude, RN

## 2020-10-10 DIAGNOSIS — N921 Excessive and frequent menstruation with irregular cycle: Secondary | ICD-10-CM | POA: Insufficient documentation

## 2020-10-10 NOTE — Progress Notes (Signed)
    SUBJECTIVE:   CHIEF COMPLAINT / HPI:   Break thru bleeding on depo: patient has a history of AUB, which is why she started the Depo to stop bleeding/periods, however reports she had 2 weeks of no bleeding and then it started back up again. She is having spotting daily but sometimes has more than that, just not as much as regular period. Is going through about 2-3 pads daily that are NOT soaked thru, just get wet. Does have occasional dizziness with standing from seated position; she denies shortness of breath. She does not have a history of migraines. No known history of family history of uterine cancer.   PERTINENT  PMH / PSH:  Asthma, MDD, prolonged menses, anemia  OBJECTIVE:   BP 108/78   Pulse 94   Ht 5\' 3"  (1.6 m)   Wt 222 lb 4 oz (100.8 kg)   SpO2 97%   BMI 39.37 kg/m    Physical exam: General: Well-appearing patient, no apparent distress Respiratory: CTA bilaterally, comfortable work of breathing Cardio: RRR, S1-S2 present, no murmurs appreciated, brisk capillary refill appreciated to fingertips, no conjunctival pallor concerning for severe anemia   ASSESSMENT/PLAN:   Breakthrough bleeding on Depo-Provera Patient experiencing breakthrough bleeding on Depo-Provera, has already had 2 Depo shots. -Hg check at today's visit stable at 10.8, patient does have a history of anemia, however is not showing any concerning signs for symptomatic anemia at this time -Will trial patient on Sprintec for 1 month to reduce/stop bleeding, patient has no history of migraines -follow up as needed     , DO [facility]

## 2020-10-11 ENCOUNTER — Encounter: Payer: Self-pay | Admitting: Family Medicine

## 2020-10-11 ENCOUNTER — Other Ambulatory Visit: Payer: Self-pay | Admitting: Family Medicine

## 2020-10-11 ENCOUNTER — Ambulatory Visit (INDEPENDENT_AMBULATORY_CARE_PROVIDER_SITE_OTHER): Payer: BC Managed Care – PPO | Admitting: Family Medicine

## 2020-10-11 ENCOUNTER — Ambulatory Visit (INDEPENDENT_AMBULATORY_CARE_PROVIDER_SITE_OTHER): Payer: BC Managed Care – PPO

## 2020-10-11 ENCOUNTER — Other Ambulatory Visit: Payer: Self-pay

## 2020-10-11 VITALS — BP 108/78 | HR 94 | Ht 63.0 in | Wt 222.2 lb

## 2020-10-11 DIAGNOSIS — Z23 Encounter for immunization: Secondary | ICD-10-CM | POA: Diagnosis not present

## 2020-10-11 DIAGNOSIS — N921 Excessive and frequent menstruation with irregular cycle: Secondary | ICD-10-CM | POA: Diagnosis not present

## 2020-10-11 LAB — POCT HEMOGLOBIN: Hemoglobin: 10.8 g/dL — AB (ref 11–14.6)

## 2020-10-11 MED ORDER — MEDROXYPROGESTERONE ACETATE 150 MG/ML IM SUSP
150.0000 mg | Freq: Once | INTRAMUSCULAR | Status: AC
  Administered 2020-10-11: 150 mg via INTRAMUSCULAR

## 2020-10-11 MED ORDER — NORGESTIMATE-ETH ESTRADIOL 0.25-35 MG-MCG PO TABS: 1.0000 | ORAL_TABLET | Freq: Every day | ORAL | 0 refills | Status: DC

## 2020-10-11 MED FILL — FEMYNOR 0.25-35 MG-MCG TABS: 0.25-35 | 28 days supply | Qty: 28 | Fill #0

## 2020-10-11 NOTE — Patient Instructions (Addendum)
Thank you for coming in to see Elizabeth Waters today! Please see below to review our plan for today's visit:  1.  I have sent in a prescription for Sprintec for you, this is an oral contraceptive pill that is a combination of both progesterone and estrogen.  Take this tablet once daily for the next 28 days to help decrease/regulate your periods.  Your periods may even stop altogether.  Please follow-up with Elizabeth Waters in 4-6 weeks as needed if you continue to have issues.  Breakthrough bleeding/spotting on Depo shot can occur with the first 2-3 shots. 2.  Should your bleeding increase or should you develop worsening symptoms such as dizziness, fatigue, shortness of breath, or chest pain, please seek emergency medical care!  Please call the clinic at [phone] if your symptoms worsen or you have any concerns. It was our pleasure to serve you!   Dr. Peggyann Shoals [facility]

## 2020-10-11 NOTE — Assessment & Plan Note (Addendum)
Patient experiencing breakthrough bleeding on Depo-Provera, has already had 2 Depo shots. -Hg check at today's visit stable at 10.8, patient does have a history of anemia, however is not showing any concerning signs for symptomatic anemia at this time -Will trial patient on Sprintec for 1 month to reduce/stop bleeding, patient has no history of migraines -follow up as needed

## 2020-11-15 ENCOUNTER — Encounter: Payer: Self-pay | Admitting: Family Medicine

## 2020-11-15 ENCOUNTER — Other Ambulatory Visit: Payer: Self-pay

## 2020-11-15 ENCOUNTER — Ambulatory Visit (INDEPENDENT_AMBULATORY_CARE_PROVIDER_SITE_OTHER): Payer: BC Managed Care – PPO | Admitting: Family Medicine

## 2020-11-15 DIAGNOSIS — R069 Unspecified abnormalities of breathing: Secondary | ICD-10-CM

## 2020-11-15 DIAGNOSIS — Z23 Encounter for immunization: Secondary | ICD-10-CM | POA: Diagnosis not present

## 2020-11-15 DIAGNOSIS — M542 Cervicalgia: Secondary | ICD-10-CM | POA: Diagnosis not present

## 2020-11-15 DIAGNOSIS — F332 Major depressive disorder, recurrent severe without psychotic features: Secondary | ICD-10-CM | POA: Diagnosis not present

## 2020-11-15 NOTE — Patient Instructions (Addendum)
Elizabeth Waters - I believe you have tight muscle points in your trapezius muscle in your neck and upper back on the left.  The best treatment for these "Trigger Points" in the muscle is to stretch the tight muscle bundles out by using neck stretching exercises.    I think using your Tiger Balm rubbed into the left neck before you do the stretching exercises may help you stretch better. It will take at least two weeks of stretching to relieve your muscle trigger points   Remember - you can contact myself or Dr Dareen Piano by MyChart if you think your stretching exercises are not working enough and would like to see Physical Therapy to help you with relieving your pain.    Trigger Points stretching . Stretching and range-of-motion exercises Cervical side bending 1. Using good posture, sit on a stable chair or stand up. 2. Without moving your shoulders, slowly tilt your left / right ear to your shoulder until you feel a stretch in the opposite side neck muscles. You should be looking straight ahead. 3. Hold for _____10_____ seconds. 4. Repeat with the other side of your neck. Repeat _____5_____ times. Complete this exercise _____3_____ times a day.   Cervical rotation 1. Using good posture, sit on a stable chair or stand up. 2. Slowly turn your head to the side as if you are looking over your left / right shoulder. ? Keep your eyes level with the ground. ? Stop when you feel a stretch along the side and the back of your neck. 3. Hold for _____10_____ seconds. 4. Repeat this by turning to your other side. Repeat _____5_____ times. Complete this exercise _____3_____ times a day.   Thoracic extension and pectoral stretch 1. Roll a towel or a small blanket so it is about 4 inches (10 cm) in diameter. 2. Lie down on your back on a firm surface. 3. Put the towel lengthwise, under your spine in the middle of your back. It should not be under your shoulder blades. The towel should line up with your spine  from your middle back to your lower back. 4. Put your hands behind your head and let your elbows fall out to your sides. 5. Hold for ______10____ seconds. Repeat _____5_____ times. Complete this exercise ____3______ times a day. Strengthening exercises Isometric upper cervical flexion 1. Lie on your back with a thin pillow behind your head and a small rolled-up towel under your neck. 2. Gently tuck your chin toward your chest and nod your head down to look toward your feet. Do not lift your head off the pillow. 3. Hold for ____10______ seconds. 4. Release the tension slowly. Relax your neck muscles completely before you repeat this exercise. Repeat ______5____ times. Complete this exercise ____3______ times a day. Isometric cervical extension 1. Stand about 6 inches (15 cm) away from a wall, with your back facing the wall. 2. Place a soft object, about 6-8 inches (15-20 cm) in diameter, between the back of your head and the wall. A soft object could be a small pillow, a ball, or a folded towel. 3. Gently tilt your head back and press into the soft object. Keep your jaw and forehead relaxed. 4. Hold for ____10______ seconds. 5. Release the tension slowly. Relax your neck muscles completely before you repeat this exercise. Repeat ____5______ times. Complete this exercise ___3_______ times a day.   Posture and body mechanics Body mechanics refers to the movements and positions of your body while you do your  daily activities. Posture is part of body mechanics. Good posture and healthy body mechanics can help to relieve stress in your body's tissues and joints. Good posture means that your spine is in its natural S-curve position (your spine is neutral), your shoulders are pulled back slightly, and your head is not tipped forward. The following are general guidelines for applying improved posture and body mechanics to your everyday activities. Sitting 1. When sitting, keep your spine neutral and keep  your feet flat on the floor. Use a footrest, if necessary, and keep your thighs parallel to the floor. Avoid rounding your shoulders, and avoid tilting your head forward. 2. When working at a desk or a computer, keep your desk at a height where your hands are slightly lower than your elbows. Slide your chair under your desk so you are close enough to maintain good posture. 3. When working at a computer, place your monitor at a height where you are looking straight ahead and you do not have to tilt your head forward or downward to look at the screen.   Standing  When standing, keep your spine neutral and keep your feet about hip-width apart. Keep a slight bend in your knees. Your ears, shoulders, and hips should line up.  When you do a task in which you stand in one place for a long time, place one foot up on a stable object that is 2-4 inches (5-10 cm) high, such as a footstool. This helps keep your spine neutral.   Resting When lying down and resting, avoid positions that are most painful for you. Try to support your neck in a neutral position. You can use a contour pillow or a small rolled-up towel. Your pillow should support your neck but not push on it. This information is not intended to replace advice given to you by your health care provider. Make sure you discuss any questions you have with your health care provider.

## 2020-11-16 ENCOUNTER — Encounter: Payer: Self-pay | Admitting: Family Medicine

## 2020-11-16 DIAGNOSIS — R069 Unspecified abnormalities of breathing: Secondary | ICD-10-CM | POA: Insufficient documentation

## 2020-11-16 DIAGNOSIS — M542 Cervicalgia: Secondary | ICD-10-CM | POA: Insufficient documentation

## 2020-11-16 NOTE — Assessment & Plan Note (Signed)
New problem Ms Deshong reports having periods of feeling discomfort in the sterno-manubrium area of chest, then notice she has not been breathing.  When she takes a deep breath, the discomfort resolves.  There is no dyspnea, cough, restriction in inspiration. No feeling that she is going to pass out. She denies snoring or gasping in sleep.     I encouraged Ms Ripp to keep a symptom diary to see if there is a pattern to this symptomology.  She can share that with [place] at follow up.

## 2020-11-16 NOTE — Progress Notes (Signed)
Elizabeth Waters is alone Sources of clinical information for visit is/are patient. Nursing assessment for this office visit was reviewed with the patient for accuracy and revision.     Previous Report(s) Reviewed: office notes and x-ray reports  Depression screen [facility] 2/9 11/15/2020  Decreased Interest 1  Down, Depressed, Hopeless 2  PHQ - 2 Score 3  Altered sleeping 3  Tired, decreased energy 2  Change in appetite 0  Feeling bad or failure about yourself  0  Trouble concentrating 3  Moving slowly or fidgety/restless 2  Suicidal thoughts 0  PHQ-9 Score 13  Difficult doing work/chores Somewhat difficult    No flowsheet data found.  PHQ9 SCORE ONLY 11/15/2020 10/11/2020 07/18/2020  PHQ-9 Total Score 13 17 20     Adult vaccines due  Topic Date Due  . TETANUS/TDAP  11/16/2030    Health Maintenance Due  Topic Date Due  . Hepatitis C Screening  Never done  . CHLAMYDIA SCREENING  06/25/2018  . HPV VACCINES (3 - 3-dose series) 01/15/2021      History/P.E. limitations: none  Adult vaccines due  Topic Date Due  . TETANUS/TDAP  11/16/2030   There are no preventive care reminders to display for this patient.  Health Maintenance Due  Topic Date Due  . Hepatitis C Screening  Never done  . CHLAMYDIA SCREENING  06/25/2018  . HPV VACCINES (3 - 3-dose series) 01/15/2021     Chief Complaint  Patient presents with  . Spasms    Pt stated that its mostly on her left side.  . Memory Loss  . stopping breathing

## 2020-11-16 NOTE — Assessment & Plan Note (Addendum)
New problem No further diagnostic work up at this time.  Onset: Elizabeth Waters relates the neck pain and spasm start to starting the recent trial of Sprintec in mid-February for BTB on Depoprovera Location: Spasm on left side of neck and pain on right side of neck Quality: ache Severity: mild-to-moderate Function: no impairment of activities or sleep Pattern: intermittent Course: uncertain Radiation: no Relief: Tiger Balm Precipitant: no recalled injury to head/neck, shoulders/arms.  Associated Symptoms:       Restricted ROM/stiffness/swelling:  no       Muscle ache/cramp/spasms: intermittent feeling of neck being pulled to left       Muscle strength change: no change in arm or leg strength       Change in sensation (dysesthesia/itch or numbness): no numbness in arms     Trauma (Acute or Chronic): no history of injury to neck or shoulders recalled Prior Diagnostic Testing or Treatments: no prior work up or treatment for neck problems Relevant PMH/PSH: MDD  Physical Exam General: Flat affect, quiet voice, eye contact was decreased Neck: Negative spurling's test Full neck range of motion rotation L&R, Lateral Bend L&R, Ext/Flex Grip strength and sensation normal in bilateral hands Strength good C5 to T1 distribution Strength normal knees and ankles.  No sensory change to C5 to T1 Normal motor tone arms and legs Reflexes normal biceps, triceps, knee and ankles.   Point tenderness on palpation right posterior neck, along edge, body and intrascapular region of the right trapezius muscle.   After examination, Elizabeth Waters showed occasional brief clusters of two to three lateral bend tics to left of neck/head.  She did not seemed distrubed by the tics, and did not try to point them out to me.   ASSESSMENT AND PLAN  Working explanation are multiple tender points in muscle of left lateral neck and trapezius muscle.  I am uncertain how to explain the tics (Involuntary? Voluntary?) No evidence  of Serotonin Syndrome.  While there are reports of chorea exacerbations with Sprintec formulations, these brief tics were not choreiform.  After discussion with Elizabeth Waters of HEP Vs OP Physical Therapy, Elizabeth Waters wanted to try HEP first. Stretching HEP  If not improving with HEP then contact [facility] about referral to Physical Therapy.  Elizabeth Waters has a follow up with Dr Dareen Piano in next 2-3 weeks.

## 2020-11-16 NOTE — Assessment & Plan Note (Addendum)
Established problem. Stable. Denies thoughts of death No thoughts of suicide Taking the Trintellex as prescribed No changes at this time

## 2020-11-24 ENCOUNTER — Other Ambulatory Visit (HOSPITAL_COMMUNITY): Payer: Self-pay

## 2020-12-04 NOTE — Progress Notes (Signed)
SUBJECTIVE:   CHIEF COMPLAINT / HPI:   Near syncopal episode: Patient presents to clinic today reporting several occurrences of syncopal/near syncopal episodes that have occurred over the last 5 years.  Patient reports she has had 6-7 of these episodes this year alone (in the past 3-1/2 months).  Episodes are often triggered by change in position such as standing from seated position, bending forward to tie her shoe, feeling severe pain or getting very hot.  She says after the episodes happen she feels as if her heart beats faster.  She is usually able to recover from these episodes after several minutes.  Elevated PHQ screening/history of depression: Patient's PHQ screening today is 17, answer to question 9 is 0.  Patient reports that the month of April is psychologically very difficult for her.  She reports that she is safe to herself and others.  For history of depression she is currently prescribed Trintellix 10 mg daily.   Trigger point of neck: Patient recently recently seen 3/24 by Dr. McDiarmid with concerns for muscle tightness in the right side of her neck, trapezius, right upper back and shoulder.  Patient reports that the pain in her neck has not gotten much better, she continues to have tight muscles, she believes that the emotional difficulties of this month which occur every year are particularly making the pain/discomfort worse.  Denies any fevers, body aches, chills, neck stiffness.  Reports her neck feels better when it is being lightly pressed on during physical exam.  Health maintenance: due for Hep C screen and Chlamydia screen, unable to have completed today  PERTINENT  PMH / PSH:  Patient Active Problem List   Diagnosis Date Noted  . POTS (postural orthostatic tachycardia syndrome) 12/05/2020  . Trigger point with neck pain 11/16/2020  . Breathing problem 11/16/2020  . Breakthrough bleeding on Depo-Provera 10/10/2020  . anemia (HCC) 05/01/2020  . Encounter for initial  prescription of injectable contraceptive 05/01/2020  . Prolonged menstrual cycle 05/01/2020  . Plantar fasciitis, bilateral 05/01/2020  . 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 Extrinsic asthma with exacerbation 10/21/2016  . Asthma exacerbation 10/21/2016    OBJECTIVE:   BP 115/60   Pulse 89   Ht 5\' 3"  (1.6 m)   Wt 221 lb (100.2 kg)   SpO2 99%   BMI 39.15 kg/m    Physical exam: General: Well-appearing, nontoxic, pleasant patient Respiratory: Comfortable work of breathing on room air  Orthostatics: Lying down: BP 112/74, pulse 74 Sitting up:    BP 91/70, pulse 84 Standing:     BP 91/73, pulse 102  ASSESSMENT/PLAN:   POTS (postural orthostatic tachycardia syndrome) Patient showing signs suggestive of POTS.  Patient with positive orthostatics and that her systolic BP fell 20 points from lying down to sitting/standing.  Patient had 28 point increase in her heart rate with transition change from lying down to standing up, criteria for orthostatic hypotension is 30 bpm. -Drink 2-3 L of water with an additional 1 teaspoon of sugar and 1 teaspoon of salt.  This can be sweetened with mio.   -Encouraged to start an exercise program 30 minutes 4 times weekly to help decrease symptoms associated with POTS  -Patient given information regarding POTS  Trigger point with neck pain Not much better since last visit.  Feels better with light touch/manipulation during office visit. -We will trial patient on Flexeril low-dose 5 mg at night before bedtime to help "reset" her muscles.  Instructed not to take medication during the day as it can make her sleepy, potentially make her more susceptible to syncopal episodes.  MDD (major depressive disorder), recurrent severe, without psychosis (HCC) PHQ 9 score today reviewed with patient, discussion was had. -Would like for patient to seek additional counseling and help with her resources available to  her during this difficult month -Continue to take Trintellix grams -Strict return precautions provided as well as resources regarding [facility] and suicide hotline     Dollene Cleveland, DO [facility]

## 2020-12-04 NOTE — Patient Instructions (Addendum)
Thank you for coming in to see Elizabeth Waters today! Please see below to review our plan for today's visit:  1. Take an Iron/Ferrous Sulfate Supplement 325mg  (65 mg of elemental iron) every other day to decrease risk/symptoms of anemia. 2.  Drink 2-3 L of water with an additional 1 teaspoon of sugar and 1 teaspoon of salt.  You can sweeten this with Mio.  3.  I strongly encourage you to take on an exercise program in which you exercise for at least 30 minutes 4 times weekly.  This will help to decrease your symptoms associated with POTS (see information below)  Please call the clinic at [phone] if your symptoms worsen or you have any concerns. It was our pleasure to serve you!   Dr. [phone]  Family Medicine    Postural Orthostatic Tachycardia Syndrome Postural orthostatic tachycardia syndrome (POTS) is a group of symptoms that occur when a person stands up after lying down. POTS occurs when less blood than normal flows to the body when you stand up. The reduced blood flow to the body makes the heart beat rapidly. POTS may be associated with another medical condition, or it may occur on its own. What are the causes? The cause of this condition is not known, but many conditions and diseases are associated with it. What increases the risk? This condition is more likely to develop in:  Women 43-51 years old.  Women who are pregnant.  Women who are in their period (menstruating).  People who have certain conditions, such as: ? Infection from a virus. ? Attacks of healthy organs by the body's immunity (autoimmune disease). ? Losing a lot of red blood cells (anemia). ? Losing too much water in the body (dehydration). ? An overactive thyroid (hyperthyroidism).  People who take certain medicines.  People who have had a major injury.  People who have had surgery. What are the signs or symptoms? The most common symptom of this condition is light-headedness when one stands  from a lying or sitting position. Other symptoms may include:  Feeling a rapid increase in the heartbeat (tachycardia) within 10 minutes of standing up.  Fainting.  Weakness.  Confusion.  Trembling.  Shortness of breath.  Sweating or flushing.  Headache.  Chest pain.  Breathing that is deeper and faster than normal (hyperventilation).  Nausea.  Anxiety. Symptoms may be worse in the morning, and they may be relieved by lying down. How is this diagnosed? This condition is diagnosed based on:  Your symptoms.  Your medical history.  A physical exam.  Checking your heart rate when you are lying down and after you stand up.  Checking your blood pressure when you go from lying down to standing up.  Blood tests to measure hormones that change with blood pressure. The blood tests will be done when you are lying down and when you are standing up. You may have other tests to check for conditions or diseases that are associated with POTS. How is this treated? Treatment for this condition depends on how severe your symptoms are and whether you have any conditions or diseases that are associated with POTS. Treatment may involve:  Treating any conditions or diseases that are associated with POTS.  Drinking two glasses of water before getting up from a lying position.  Eating more salt (sodium).  Taking medicine to control blood pressure and heart rate (beta-blocker).  Avoiding certain medicines.  Starting an exercise program under the supervision of a health care  provider. Follow these instructions at home: Medicines  Take over-the-counter and prescription medicines only as told by your health care provider.  Let your health care provider know about all prescription or over-the-counter medicines. These include herbs, vitamins, and supplements. You may need to stop or adjust some medicines if they cause this condition.  Talk with your health care provider before starting  any new medicines. Eating and drinking  Drink enough fluid to keep your urine pale yellow.  If told by your health care provider, drink two glasses of water before getting up from a lying position.  Follow instructions from your health care provider about how much sodium you should eat.  Avoid heavy meals. Eat several small meals a day instead of a few large meals.   General instructions  Do an aerobic exercise for 20 minutes a day, at least 3 days a week.  Ask your health care provider what kinds of exercise are safe for you.  Do not use any products that contain nicotine or tobacco, such as cigarettes and e-cigarettes. These can interfere with blood flow. If you need help quitting, ask your health care provider.  Keep all follow-up visits as told by your health care provider. This is important. Contact a health care provider if:  Your symptoms do not improve after treatment.  Your symptoms get worse.  You develop new symptoms. Get help right away if:  You have chest pain.  You have difficulty breathing.  You have fainting episodes. These symptoms may represent a serious problem that is an emergency. Do not wait to see if the symptoms will go away. Get medical help right away. Call your local emergency services (911 in the [place] Do not drive yourself to the hospital. Summary  POTS is a condition that can cause light-headedness, fainting, and palpitations when you go from a sitting or lying position to a standing position. It occurs when less blood than normal flows to the body when you stand up.  Treatment for this condition includes treating any underlying conditions, drinking plenty of water, stopping or changing some medicines, or starting an exercise program.  Get help right away if you have chest pain, difficulty breathing, or fainting episodes. These may represent a serious problem that is an emergency. This information is not intended to replace advice given to you  by your health care provider. Make sure you discuss any questions you have with your health care provider. Document Revised: 09/22/2017 Document Reviewed: 09/22/2017 Elsevier Patient Education  2021 [employer].

## 2020-12-05 ENCOUNTER — Other Ambulatory Visit (HOSPITAL_COMMUNITY): Payer: Self-pay

## 2020-12-05 ENCOUNTER — Encounter: Payer: Self-pay | Admitting: Family Medicine

## 2020-12-05 ENCOUNTER — Ambulatory Visit (INDEPENDENT_AMBULATORY_CARE_PROVIDER_SITE_OTHER): Payer: BC Managed Care – PPO | Admitting: Family Medicine

## 2020-12-05 ENCOUNTER — Other Ambulatory Visit: Payer: Self-pay

## 2020-12-05 VITALS — BP 115/60 | HR 89 | Ht 63.0 in | Wt 221.0 lb

## 2020-12-05 DIAGNOSIS — F332 Major depressive disorder, recurrent severe without psychotic features: Secondary | ICD-10-CM | POA: Diagnosis not present

## 2020-12-05 DIAGNOSIS — I498 Other specified cardiac arrhythmias: Secondary | ICD-10-CM | POA: Diagnosis not present

## 2020-12-05 DIAGNOSIS — M542 Cervicalgia: Secondary | ICD-10-CM | POA: Diagnosis not present

## 2020-12-05 DIAGNOSIS — G90A Postural orthostatic tachycardia syndrome (POTS): Secondary | ICD-10-CM | POA: Insufficient documentation

## 2020-12-05 MED ORDER — CYCLOBENZAPRINE HCL 5 MG PO TABS
5.0000 mg | ORAL_TABLET | Freq: Every day | ORAL | 1 refills | Status: DC | PRN
  Filled 2020-12-05: qty 10, 10d supply, fill #0

## 2020-12-13 NOTE — Assessment & Plan Note (Signed)
Patient showing signs suggestive of POTS.  Patient with positive orthostatics and that her systolic BP fell 20 points from lying down to sitting/standing.  Patient had 28 point increase in her heart rate with transition change from lying down to standing up, criteria for orthostatic hypotension is 30 bpm. -Drink 2-3 L of water with an additional 1 teaspoon of sugar and 1 teaspoon of salt.  This can be sweetened with mio.   -Encouraged to start an exercise program 30 minutes 4 times weekly to help decrease symptoms associated with POTS  -Patient given information regarding POTS

## 2020-12-13 NOTE — Assessment & Plan Note (Signed)
Not much better since last visit.  Feels better with light touch/manipulation during office visit. -We will trial patient on Flexeril low-dose 5 mg at night before bedtime to help "reset" her muscles.  Instructed not to take medication during the day as it can make her sleepy, potentially make her more susceptible to syncopal episodes.

## 2020-12-13 NOTE — Assessment & Plan Note (Signed)
PHQ 9 score today reviewed with patient, discussion was had. -Would like for patient to seek additional counseling and help with her resources available to her during this difficult month -Continue to take Trintellix grams -Strict return precautions provided as well as resources regarding [facility] and suicide hotline

## 2020-12-17 ENCOUNTER — Other Ambulatory Visit: Payer: Self-pay

## 2020-12-17 ENCOUNTER — Ambulatory Visit (INDEPENDENT_AMBULATORY_CARE_PROVIDER_SITE_OTHER): Payer: BC Managed Care – PPO

## 2020-12-17 DIAGNOSIS — Z23 Encounter for immunization: Secondary | ICD-10-CM

## 2020-12-17 NOTE — Progress Notes (Signed)
Patient presents in nurse clinic for #3 HPV vaccine.  Vaccine administered LD without complication.  Patient reminded she is due for next depo shot on 5/5.  Apt made for 5/6.

## 2020-12-28 ENCOUNTER — Other Ambulatory Visit: Payer: Self-pay

## 2020-12-28 ENCOUNTER — Ambulatory Visit (INDEPENDENT_AMBULATORY_CARE_PROVIDER_SITE_OTHER): Payer: BC Managed Care – PPO

## 2020-12-28 DIAGNOSIS — Z3042 Encounter for surveillance of injectable contraceptive: Secondary | ICD-10-CM | POA: Diagnosis not present

## 2020-12-28 MED ORDER — MEDROXYPROGESTERONE ACETATE 150 MG/ML IM SUSP
150.0000 mg | Freq: Once | INTRAMUSCULAR | Status: AC
  Administered 2020-12-28: 150 mg via INTRAMUSCULAR

## 2020-12-28 NOTE — Progress Notes (Signed)
Patient here today for Depo Provera injection and is within her dates.    Last contraceptive appt was 10/11/2020  Depo given in [facility] today.  Site unremarkable & patient tolerated injection.    Next injection due 7/22-03/29/21.  Reminder card given.    Veronda Prude, RN

## 2021-01-01 ENCOUNTER — Other Ambulatory Visit: Payer: Self-pay

## 2021-01-01 ENCOUNTER — Ambulatory Visit (INDEPENDENT_AMBULATORY_CARE_PROVIDER_SITE_OTHER): Payer: BC Managed Care – PPO | Admitting: Family Medicine

## 2021-01-01 ENCOUNTER — Encounter: Payer: Self-pay | Admitting: Family Medicine

## 2021-01-01 VITALS — BP 112/74 | Ht 63.0 in | Wt 221.0 lb

## 2021-01-01 DIAGNOSIS — M79673 Pain in unspecified foot: Secondary | ICD-10-CM

## 2021-01-01 DIAGNOSIS — M7741 Metatarsalgia, right foot: Secondary | ICD-10-CM

## 2021-01-01 DIAGNOSIS — M7742 Metatarsalgia, left foot: Secondary | ICD-10-CM | POA: Diagnosis not present

## 2021-01-01 NOTE — Patient Instructions (Signed)
Thank you for coming in to see [place] today! Please see below to review our plan for today's visit:  You are experiencing "metatarsalgia" and heel pain.  1.  I have placed a referral to podiatry.  Please look out for numbers that you may not recognize calling you to help you get scheduled.  They can do any imaging as needed and help place supportive pads in your shoes to help decrease your pain. 2.  Please follow-up with [place] as needed!   Please call the clinic at [phone] if your symptoms worsen or you have any concerns. It was our pleasure to serve you!   Dr. Peggyann Shoals [facility]

## 2021-01-01 NOTE — Assessment & Plan Note (Signed)
Patient with metatarsalgia, says that she has a history of such, also with pes planus bilaterally.  Patient is asking for referral to a podiatrist for further evaluation, treatment. -Referral made for podiatrist -Recommended patient try metatarsal pads, discussion was had regarding proper placement of metatarsal pads, recommended extra small size -Patient was also recommended to wear slightly wider shoes to give her toes little more space to hopefully help decrease some of the pain -Can also try over-the-counter shoe inserts until she is able to be seen by specialist

## 2021-01-01 NOTE — Progress Notes (Signed)
    SUBJECTIVE:   CHIEF COMPLAINT / HPI:   Foot problem: Patient presents to clinic today reporting that at the end of April she went out for her birthday and was wearing high-heeled shoes.  Soon after putting them on (about 15 minutes) she felt a severe pain in the bottom of her foot, towards the ball.  She said it felt like a pins/needles and stabbing sensation.  She was having the sensation in both of her feet.  Throughout the evening she had to take that she was on and off, oftentimes took them off to walk around when she was outside.  This helped to relieve her pain.  Patient reports that she is also had similar pain on the ball of her foot as well as on her heel on both feet even with standing barefooted on hardwood floors.  She denies any accidents, traumas.  PERTINENT  PMH / PSH:  Patient Active Problem List   Diagnosis Date Noted  . Pain of plantar aspect of heel 01/01/2021  . Metatarsalgia of both feet 01/01/2021  . POTS (postural orthostatic tachycardia syndrome) 12/05/2020  . Trigger point with neck pain 11/16/2020  . Breathing problem 11/16/2020  . Breakthrough bleeding on Depo-Provera 10/10/2020  . anemia (HCC) 05/01/2020  . Encounter for initial prescription of injectable contraceptive 05/01/2020  . Prolonged menstrual cycle 05/01/2020  . Plantar fasciitis, bilateral 05/01/2020  . 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 Extrinsic asthma with exacerbation 10/21/2016  . Asthma exacerbation 10/21/2016     OBJECTIVE:   BP 112/74   Ht 5\' 3"  (1.6 m)   Wt 221 lb (100.2 kg)   BMI 39.15 kg/m    Physical exam: General: Well-appearing, no apparent distress Respiratory: Comfortable work of breathing on room air Feet: Inspection:  No obvious bony deformity.  No swelling, erythema, or bruising.  Patient somewhat flat-footed bilaterally Palpation: No tenderness to palpation, when palpating the balls of feet patient reports that  that is where her pain usually is ROM: Full  ROM of the ankle. Normal midfoot flexibility Strength: 5/5 strength ankle in all planes Neurovascular: N/V intact distally in the lower extremity  ASSESSMENT/PLAN:   Metatarsalgia of both feet Patient with metatarsalgia, says that she has a history of such, also with pes planus bilaterally.  Patient is asking for referral to a podiatrist for further evaluation, treatment. -Referral made for podiatrist -Recommended patient try metatarsal pads, discussion was had regarding proper placement of metatarsal pads, recommended extra small size -Patient was also recommended to wear slightly wider shoes to give her toes little more space to hopefully help decrease some of the pain -Can also try over-the-counter shoe inserts until she is able to be seen by specialist     , DO [place]

## 2021-01-14 ENCOUNTER — Encounter: Payer: Self-pay | Admitting: Podiatry

## 2021-01-14 ENCOUNTER — Other Ambulatory Visit: Payer: Self-pay

## 2021-01-14 ENCOUNTER — Ambulatory Visit (INDEPENDENT_AMBULATORY_CARE_PROVIDER_SITE_OTHER): Payer: BC Managed Care – PPO | Admitting: Podiatry

## 2021-01-14 ENCOUNTER — Ambulatory Visit (INDEPENDENT_AMBULATORY_CARE_PROVIDER_SITE_OTHER): Payer: BC Managed Care – PPO

## 2021-01-14 DIAGNOSIS — M722 Plantar fascial fibromatosis: Secondary | ICD-10-CM

## 2021-01-14 DIAGNOSIS — M76822 Posterior tibial tendinitis, left leg: Secondary | ICD-10-CM

## 2021-01-14 DIAGNOSIS — M76821 Posterior tibial tendinitis, right leg: Secondary | ICD-10-CM | POA: Diagnosis not present

## 2021-01-14 NOTE — Progress Notes (Signed)
Subjective:   Patient ID: Elizabeth Waters, female   DOB: 20 y.o.   MRN: [id number]   HPI Patient states she has had approximate 5 to 6 years of pain in her ankles and heels and arches and she is moderately overweight but has not been able to work weightbearing job.  She presents with her mother today she does not smoke she would like to be more active and lose weight   Review of Systems  All other systems reviewed and are negative.       Objective:  Physical Exam Vitals and nursing note reviewed.  Constitutional:      Appearance: She is well-developed.  Pulmonary:     Effort: Pulmonary effort is normal.  Musculoskeletal:        General: Normal range of motion.  Skin:    General: Skin is warm.  Neurological:     Mental Status: She is alert.     Neurovascular status intact muscle strength adequate range of motion adequate significant collapse medial longitudinal arch bilateral with stress on the posterior tibial tendon bilateral inflammation of the tendon itself and moderate discomfort plantarly.  Good digital perfusion no indication of range of motion loss with excessive eversion noted bilateral     Assessment:  Significant flatfoot deformity bilateral leading to probability of posterior tibial inflammation plantar fascial inflammation     Plan:  H&P reviewed condition recommended orthotics and patient is casted for functional orthotics to lift the arch up with all instructions given today.  Patient will be seen back when orthotics return and encouraged to call questions concerns prior  X-rays indicate there is moderate collapse medial longitudinal arch bilateral

## 2021-02-05 ENCOUNTER — Telehealth: Payer: Self-pay | Admitting: Podiatry

## 2021-02-05 NOTE — Telephone Encounter (Signed)
Orthotics in.. lvm for pt to call to schedule an appt to pick them up. °

## 2021-02-11 ENCOUNTER — Ambulatory Visit (INDEPENDENT_AMBULATORY_CARE_PROVIDER_SITE_OTHER): Payer: BC Managed Care – PPO

## 2021-02-11 ENCOUNTER — Other Ambulatory Visit: Payer: Self-pay

## 2021-02-11 DIAGNOSIS — M722 Plantar fascial fibromatosis: Secondary | ICD-10-CM

## 2021-02-11 NOTE — Progress Notes (Signed)
Patient in office to pick-up custom orthotics today. Patient tried on the orthotics and voiced being happy with the fit and feel. Break-in process explained to patient in detail. Patient verbalized understanding. Advised patient to call the office with any questions, comments or concerns.  

## 2021-03-20 ENCOUNTER — Ambulatory Visit (INDEPENDENT_AMBULATORY_CARE_PROVIDER_SITE_OTHER): Payer: BC Managed Care – PPO

## 2021-03-20 ENCOUNTER — Other Ambulatory Visit: Payer: Self-pay

## 2021-03-20 DIAGNOSIS — Z3042 Encounter for surveillance of injectable contraceptive: Secondary | ICD-10-CM

## 2021-03-20 MED ORDER — MEDROXYPROGESTERONE ACETATE 150 MG/ML IM SUSP
150.0000 mg | Freq: Once | INTRAMUSCULAR | Status: AC
  Administered 2021-03-20: 150 mg via INTRAMUSCULAR

## 2021-03-20 NOTE — Progress Notes (Signed)
Patient here today for Depo Provera injection and is within her dates.    Last contraceptive appt was 10/11/2020  Depo given in [place] today.  Site unremarkable & patient tolerated injection.    Next injection due 10/12-10/26.  Reminder card given.    Veronda Prude, RN

## 2021-04-05 ENCOUNTER — Ambulatory Visit (INDEPENDENT_AMBULATORY_CARE_PROVIDER_SITE_OTHER): Payer: BC Managed Care – PPO | Admitting: Family Medicine

## 2021-04-05 ENCOUNTER — Other Ambulatory Visit: Payer: Self-pay

## 2021-04-05 ENCOUNTER — Encounter: Payer: Self-pay | Admitting: Family Medicine

## 2021-04-05 ENCOUNTER — Other Ambulatory Visit (HOSPITAL_COMMUNITY)
Admission: RE | Admit: 2021-04-05 | Discharge: 2021-04-05 | Disposition: A | Discharge status: Home / Self Care | Payer: BC Managed Care – PPO | Source: Ambulatory Visit | Attending: Family Medicine | Admitting: Family Medicine

## 2021-04-05 VITALS — BP 110/80 | HR 78 | Ht 63.0 in | Wt 207.0 lb

## 2021-04-05 DIAGNOSIS — Z124 Encounter for screening for malignant neoplasm of cervix: Secondary | ICD-10-CM | POA: Insufficient documentation

## 2021-04-05 DIAGNOSIS — F332 Major depressive disorder, recurrent severe without psychotic features: Secondary | ICD-10-CM

## 2021-04-05 DIAGNOSIS — N921 Excessive and frequent menstruation with irregular cycle: Secondary | ICD-10-CM

## 2021-04-05 NOTE — Patient Instructions (Addendum)
It was great to meet you.  Today we did your pap smear. This is a screening test for cervical cancer. I will call you if the results are abnormal. Otherwise, I will send you a letter.  If you're interested in seeing an OBGYN, you can call [facility] at [phone] ([address] [place]). Otherwise, you can schedule a separate appointment in our clinic to discuss your prolonged period.  Take care and seek immediate care sooner if you develop any concerns.  Dr. [facility]

## 2021-04-05 NOTE — Progress Notes (Signed)
    SUBJECTIVE:   CHIEF COMPLAINT / HPI:   Pap Patient presents for her pap smear. Since she is now 57, she is due for her first pap. Reports her maternal grandmother and paternal grandmother both had cancer-- one with cervical and one with ovarian (she's not sure which grandma had which type). She is not currently sexually active but has been in the past and is agreeable to GC/chlamydia testing today as well. No prior hx of STIs.  Menstrual concerns Patient states she is on day 74 of menstruating. Most days the bleeding is very light, but she has been bleeding every day for 81 days straight. She had a similar problem in October of last year when her period lasted until March. She started depo injections in March. No cramping associated with the bleeding. She has been thinking about seeing OBGYN for this issue.   PERTINENT  PMH / PSH: MDD, asthma  OBJECTIVE:   BP 110/80   Pulse 78   Ht 5\' 3"  (1.6 m)   Wt 207 lb (93.9 kg)   LMP 01/14/2021   BMI 36.67 kg/m   General: NAD, pleasant, able to participate in exam Respiratory: No respiratory distress Skin: warm and dry, no rashes noted Neuro: grossly intact GU/GYN: Exam performed in the presence of a chaperone. External genitalia within normal limits.  Vaginal mucosa pink, moist, normal rugae.  Nonfriable cervix without lesions. Moderate amount of blood noted on speculum exam.  Bimanual exam revealed normal, nongravid uterus.  No cervical motion tenderness. No adnexal masses bilaterally.     ASSESSMENT/PLAN:   Screening for Cervical Cancer Healthcare Maintenance Pap obtained today, as well as gonorrhea/chlamydia. No abnormalities noted on speculum exam.   Elevated PHQ-9 History of MDD Patient's PHQ-9 score elevated to 18 today, negative to question #9. She endorses a history of depression but denies SI. Not currently on any medications (unclear why). She will make a separate appointment to discuss her mood in more detail and  potentially re-initiate medication therapy.  Prolonged Menstrual Bleeding Patient bleeding lightly for 81 days straight. Has a history of the same and trialed Sprintec very briefly in the past. Now on depo to help with the bleeding. She wishes to see OBGYN for this. I am not acutely concerned (no symptoms of significant anemia), so we will defer to OBGYN.    01/16/2021, MD [facility]

## 2021-04-08 LAB — CYTOLOGY - PAP
Adequacy: ABSENT
Chlamydia: NEGATIVE
Comment: NEGATIVE
Comment: NORMAL
Diagnosis: NEGATIVE
Neisseria Gonorrhea: NEGATIVE

## 2021-06-13 ENCOUNTER — Other Ambulatory Visit: Payer: Self-pay

## 2021-06-13 ENCOUNTER — Ambulatory Visit (INDEPENDENT_AMBULATORY_CARE_PROVIDER_SITE_OTHER): Payer: BC Managed Care – PPO

## 2021-06-13 DIAGNOSIS — Z3042 Encounter for surveillance of injectable contraceptive: Secondary | ICD-10-CM | POA: Diagnosis not present

## 2021-06-13 MED ORDER — MEDROXYPROGESTERONE ACETATE 150 MG/ML IM SUSP
150.0000 mg | Freq: Once | INTRAMUSCULAR | Status: AC
  Administered 2021-06-13: 150 mg via INTRAMUSCULAR

## 2021-06-13 NOTE — Progress Notes (Signed)
Patient here today for Depo Provera injection and is within her dates.     Last contraceptive appt was 10/11/2020.   Depo given in [facility] today. Site unremarkable & patient tolerated injection.     Next injection due 08/29/2021-09/12/2021. Reminder card given.

## 2021-08-01 ENCOUNTER — Encounter: Payer: BC Managed Care – PPO | Admitting: Obstetrics and Gynecology

## 2021-08-15 ENCOUNTER — Encounter: Payer: BC Managed Care – PPO | Admitting: Obstetrics and Gynecology

## 2021-08-29 ENCOUNTER — Ambulatory Visit (INDEPENDENT_AMBULATORY_CARE_PROVIDER_SITE_OTHER): Payer: BC Managed Care – PPO

## 2021-08-29 ENCOUNTER — Other Ambulatory Visit: Payer: Self-pay

## 2021-08-29 DIAGNOSIS — Z30019 Encounter for initial prescription of contraceptives, unspecified: Secondary | ICD-10-CM | POA: Diagnosis not present

## 2021-08-29 MED ORDER — MEDROXYPROGESTERONE ACETATE 150 MG/ML IM SUSP
150.0000 mg | Freq: Once | INTRAMUSCULAR | Status: AC
  Administered 2021-08-29: 150 mg via INTRAMUSCULAR

## 2021-08-29 NOTE — Progress Notes (Signed)
Patient here today for Depo Provera injection and is within her dates.     Last contraceptive appt was 04/05/2021.   Depo given in [place] today. Site unremarkable & patient tolerated injection.     Next injection due 11/14/2021-11/28/2021. Reminder card given.

## 2021-09-04 ENCOUNTER — Encounter: Payer: Self-pay | Admitting: Student

## 2021-09-04 ENCOUNTER — Other Ambulatory Visit: Payer: Self-pay

## 2021-09-04 ENCOUNTER — Ambulatory Visit (INDEPENDENT_AMBULATORY_CARE_PROVIDER_SITE_OTHER): Payer: BC Managed Care – PPO | Admitting: Student

## 2021-09-04 VITALS — BP 122/87 | HR 83 | Wt 211.5 lb

## 2021-09-04 DIAGNOSIS — Z0000 Encounter for general adult medical examination without abnormal findings: Secondary | ICD-10-CM

## 2021-09-04 NOTE — Progress Notes (Signed)
Patient reports she started a period on 06/25/2019 that lasted for approx 5 months. Bleeding every day during this period of time. Began taking Depo Provera 04/27/20, this stopped bleeding for a month and then returned. Bleeding most recently stopped 07/20/21. Still reports intermittent light spotting. Reports very severe menstrual cramps prior to 2020, but after beginning Depo Provera cramping was much less severe.  Patient desires to continue Depo Provera at this time.   Fleet Contras RN 09/04/21

## 2021-09-04 NOTE — Progress Notes (Signed)
°  History:  [name] is a 21 y.o. No obstetric history on file. who presents to clinic today for follow up for irregular bleeding and menstrual cramps. She has been seen by her PCP by this problem but would like to establish care at [facility].  She reports that her periods were lasting "months" at a time and then stop for a week and then restart. She reports that she has anemia and would sometimes have dizziness. .  Her primary care provider put her on Depo and that is helping. She has not had spotting since yesterday, but it was very light. She denies any menstrual pain.   The following portions of the patient's history were reviewed and updated as appropriate: allergies, current medications, family history, past medical history, social history, past surgical history and problem list.  Review of Systems:  Review of Systems  Constitutional: Negative.   HENT: Negative.    Eyes: Negative.   Respiratory: Negative.    Cardiovascular: Negative.   Genitourinary: Negative.   Musculoskeletal: Negative.   Skin: Negative.   Neurological: Negative.   Psychiatric/Behavioral: Negative.       Objective:  Physical Exam BP 122/87    Pulse 83    Wt 211 lb 8 oz (95.9 kg)    BMI 37.47 kg/m  Physical Exam Constitutional:      Appearance: Normal appearance.  Cardiovascular:     Rate and Rhythm: Normal rate and regular rhythm.  Pulmonary:     Effort: Pulmonary effort is normal. No respiratory distress.     Breath sounds: Normal breath sounds. No stridor.  Abdominal:     General: Abdomen is flat.  Musculoskeletal:        General: Normal range of motion.     Cervical back: Normal range of motion.  Skin:    General: Skin is warm.  Neurological:     General: No focal deficit present.     Mental Status: She is alert.  Psychiatric:        Mood and Affect: Mood normal.      Labs and Imaging No results found for this or any previous visit (from the past 24 hour(s)).  No results found.  Health  Maintenance Due  Topic Date Due   Pneumococcal Vaccine 34-27 Years old (1 - PCV) Never done   Hepatitis C Screening  Never done   CHLAMYDIA SCREENING  06/25/2018   COVID-19 Vaccine (4 - Booster for Pfizer series) 12/06/2020   INFLUENZA VACCINE  03/25/2021     Assessment & Plan:   1. Well woman exam (no gynecological exam)   -reviewed pap smear; all normal. Recommend repeat pap in three years -patient declines STI testing today -patient to return if her cycles continue to be painful/heavy. Discussed that next step would be IUD or a daily medicine; patient understands.  Approximately 20 minutes of total time was spent with this patient on care, exam, reviewing results.   Marylene Land, CNM 09/04/2021 2:19 PM
